# Patient Record
Sex: Male | Born: 1955 | Race: White | Hispanic: No | Smoking: Never smoker
Health system: Southern US, Community
[De-identification: ages and names within clinical notes are randomized; demographics above are authoritative.]

## PROBLEM LIST (undated history)

## (undated) DIAGNOSIS — F1020 Alcohol dependence, uncomplicated: Secondary | ICD-10-CM

## (undated) DIAGNOSIS — IMO0001 Reserved for inherently not codable concepts without codable children: Secondary | ICD-10-CM

## (undated) DIAGNOSIS — F329 Major depressive disorder, single episode, unspecified: Secondary | ICD-10-CM

## (undated) DIAGNOSIS — E785 Hyperlipidemia, unspecified: Secondary | ICD-10-CM

## (undated) DIAGNOSIS — F32A Depression, unspecified: Secondary | ICD-10-CM

## (undated) DIAGNOSIS — F419 Anxiety disorder, unspecified: Secondary | ICD-10-CM

## (undated) DIAGNOSIS — I10 Essential (primary) hypertension: Secondary | ICD-10-CM

## (undated) HISTORY — DX: Depression, unspecified: F32.A

## (undated) HISTORY — DX: Essential (primary) hypertension: I10

## (undated) HISTORY — DX: Anxiety disorder, unspecified: F41.9

## (undated) HISTORY — DX: Hyperlipidemia, unspecified: E78.5

## (undated) HISTORY — DX: Alcohol dependence, uncomplicated: F10.20

## (undated) HISTORY — DX: Reserved for inherently not codable concepts without codable children: IMO0001

## (undated) HISTORY — PX: WISDOM TOOTH EXTRACTION: SHX21

## (undated) HISTORY — DX: Major depressive disorder, single episode, unspecified: F32.9

---

## 2011-04-16 ENCOUNTER — Other Ambulatory Visit: Payer: Self-pay | Admitting: Family Medicine

## 2011-04-16 DIAGNOSIS — R748 Abnormal levels of other serum enzymes: Secondary | ICD-10-CM

## 2011-04-24 ENCOUNTER — Ambulatory Visit
Admission: RE | Admit: 2011-04-24 | Discharge: 2011-04-24 | Disposition: A | Discharge status: Home / Self Care | Payer: 59 | Source: Ambulatory Visit | Attending: Family Medicine | Admitting: Family Medicine

## 2011-04-24 DIAGNOSIS — R748 Abnormal levels of other serum enzymes: Secondary | ICD-10-CM

## 2014-01-28 DIAGNOSIS — E785 Hyperlipidemia, unspecified: Secondary | ICD-10-CM | POA: Insufficient documentation

## 2014-01-28 DIAGNOSIS — I10 Essential (primary) hypertension: Secondary | ICD-10-CM | POA: Insufficient documentation

## 2014-08-22 DIAGNOSIS — R251 Tremor, unspecified: Secondary | ICD-10-CM | POA: Insufficient documentation

## 2016-08-06 DIAGNOSIS — G20A1 Parkinson's disease without dyskinesia, without mention of fluctuations: Secondary | ICD-10-CM | POA: Insufficient documentation

## 2016-08-06 DIAGNOSIS — G20 Parkinson's disease: Secondary | ICD-10-CM | POA: Insufficient documentation

## 2017-05-01 DIAGNOSIS — N200 Calculus of kidney: Secondary | ICD-10-CM | POA: Insufficient documentation

## 2018-09-09 ENCOUNTER — Telehealth (HOSPITAL_COMMUNITY): Payer: Self-pay | Admitting: Psychology

## 2018-09-14 ENCOUNTER — Encounter (HOSPITAL_COMMUNITY): Payer: Self-pay | Admitting: Psychology

## 2018-09-14 ENCOUNTER — Telehealth (HOSPITAL_COMMUNITY): Payer: Self-pay | Admitting: Psychology

## 2018-09-14 ENCOUNTER — Ambulatory Visit (INDEPENDENT_AMBULATORY_CARE_PROVIDER_SITE_OTHER): Payer: 59 | Admitting: Psychology

## 2018-09-14 ENCOUNTER — Other Ambulatory Visit: Payer: Self-pay

## 2018-09-14 DIAGNOSIS — Z811 Family history of alcohol abuse and dependence: Secondary | ICD-10-CM | POA: Diagnosis not present

## 2018-09-14 DIAGNOSIS — G20 Parkinson's disease: Secondary | ICD-10-CM

## 2018-09-14 DIAGNOSIS — F1020 Alcohol dependence, uncomplicated: Secondary | ICD-10-CM | POA: Diagnosis not present

## 2018-09-16 ENCOUNTER — Other Ambulatory Visit: Payer: Self-pay

## 2018-09-16 ENCOUNTER — Other Ambulatory Visit (HOSPITAL_COMMUNITY): Payer: 59 | Attending: Psychiatry | Admitting: Medical

## 2018-09-16 ENCOUNTER — Encounter (HOSPITAL_COMMUNITY): Payer: Self-pay | Admitting: Medical

## 2018-09-16 VITALS — BP 129/84 | HR 61 | Ht 71.0 in | Wt 193.0 lb

## 2018-09-16 DIAGNOSIS — F411 Generalized anxiety disorder: Secondary | ICD-10-CM | POA: Insufficient documentation

## 2018-09-16 DIAGNOSIS — K7010 Alcoholic hepatitis without ascites: Secondary | ICD-10-CM

## 2018-09-16 DIAGNOSIS — I10 Essential (primary) hypertension: Secondary | ICD-10-CM | POA: Insufficient documentation

## 2018-09-16 DIAGNOSIS — Z79899 Other long term (current) drug therapy: Secondary | ICD-10-CM | POA: Insufficient documentation

## 2018-09-16 DIAGNOSIS — Z87442 Personal history of urinary calculi: Secondary | ICD-10-CM | POA: Insufficient documentation

## 2018-09-16 DIAGNOSIS — G20 Parkinson's disease: Secondary | ICD-10-CM | POA: Insufficient documentation

## 2018-09-16 DIAGNOSIS — F1311 Sedative, hypnotic or anxiolytic abuse, in remission: Secondary | ICD-10-CM | POA: Insufficient documentation

## 2018-09-16 DIAGNOSIS — F341 Dysthymic disorder: Secondary | ICD-10-CM | POA: Insufficient documentation

## 2018-09-16 DIAGNOSIS — Z8659 Personal history of other mental and behavioral disorders: Secondary | ICD-10-CM | POA: Insufficient documentation

## 2018-09-16 DIAGNOSIS — F1994 Other psychoactive substance use, unspecified with psychoactive substance-induced mood disorder: Secondary | ICD-10-CM | POA: Diagnosis not present

## 2018-09-16 DIAGNOSIS — Z6372 Alcoholism and drug addiction in family: Secondary | ICD-10-CM | POA: Diagnosis not present

## 2018-09-16 DIAGNOSIS — F1020 Alcohol dependence, uncomplicated: Secondary | ICD-10-CM | POA: Diagnosis not present

## 2018-09-16 DIAGNOSIS — E785 Hyperlipidemia, unspecified: Secondary | ICD-10-CM

## 2018-09-16 DIAGNOSIS — F19980 Other psychoactive substance use, unspecified with psychoactive substance-induced anxiety disorder: Secondary | ICD-10-CM
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
## 2018-09-17 ENCOUNTER — Other Ambulatory Visit (HOSPITAL_COMMUNITY): Payer: 59 | Admitting: Psychology

## 2018-09-17 ENCOUNTER — Other Ambulatory Visit: Payer: Self-pay

## 2018-09-17 DIAGNOSIS — F1020 Alcohol dependence, uncomplicated: Secondary | ICD-10-CM

## 2018-09-17 DIAGNOSIS — Z6372 Alcoholism and drug addiction in family: Secondary | ICD-10-CM

## 2018-09-17 DIAGNOSIS — Z8659 Personal history of other mental and behavioral disorders: Secondary | ICD-10-CM

## 2018-09-19 NOTE — Addendum Note (Signed)
Addended by: Court Joy on: 09/19/2018 03:36 PM   Modules accepted: Kipp Brood

## 2018-09-21 ENCOUNTER — Other Ambulatory Visit (HOSPITAL_COMMUNITY): Payer: 59

## 2018-09-21 ENCOUNTER — Telehealth (HOSPITAL_COMMUNITY): Payer: Self-pay | Admitting: Psychology

## 2018-09-21 ENCOUNTER — Encounter (HOSPITAL_COMMUNITY): Payer: Self-pay | Admitting: Psychology

## 2018-09-21 NOTE — Progress Notes (Signed)
Daily Group Progress Note  Program: CD-IOP   09/21/2018 Kenneth Solomon [id number]  Diagnosis: Alcohol Use Disorder, Severe, Chronic PTSD  Sobriety Date: 08/27/18  Group Time: 1-2:30pm  Participation Level: Active  Behavioral Response: Appropriate  Type of Therapy: Process Group  Interventions: Supportive  Topic: Process: The first part of group began with process. Group members shared their experiences in early recovery since we last met on Wednesday. Group members participated in a Popsicle stick check-in activity. Three drug tests were collected.   Group Time: 2:30-4pm  Participation Level: Active  Behavioral Response: Appropriate  Type of Therapy: Psycho-education Group  Interventions: CBT  Topic: Psycho-ed: Self Compassion & Graduation; The second half of group was spent in psycho-ed discussing the importance of creating a daily schedule during times of uncertainty. During the latter part of psycho-ed, a graduation ceremony was held for a group member who successfully completed CD-IOP.  Summary: The patient reported that he had attended no AA meetings since we last met on Wednesday. The patient's Popsicle stick check in word was "gratitude." The patient shared about how grateful he is to have supportive friends, family, and daughter. The patient shared about his previous times in sobriety and how his own thinking allowed him to slip back into alcoholism. The patient was active during the process portion of group and offered supportive feedback to other group members. During the psycho-ed portion of group, the patient completed his daily schedule with the help of a CD IOP counselor. He continues to engage actively in group sessions and responded well to this intervention.    UDS collected: No  AA/NA attended?: No  Sponsor?: No   Brandon Melnick, LCAS 09/21/2018 1:46 PM

## 2018-09-22 NOTE — Progress Notes (Signed)
    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 Counselor facilitated CBT-based and emotion-focused processing questions to help PTs discuss their recovery from mind-altering drugs/alcohol. Emphasis was placed on PTs disclosing their challenges and successes pertaining to their treatment plan goals. One member had relapsed and appeared upset when disclosing his new soriety date. 3 Members met w/ [name], PA-C for medication management. One member graduated successfully. One new member started group today.      Group Time: 2:30-4  Participation Level: Active  Behavioral Response: Appropriate  Type of Therapy: Psycho-education Group  Interventions: CBT  Topic: Patients were led in a 1 hour psychoeducation session on manageing anxiety by [facility] Lissa Morales. He led group in a discussion on cognitive challenging for anxiety and stress reduction. Counselor also led a 10 min stress reduction meditation which emphasized grounding and deep breathing.     Summary: PT was attentive, engaged, and quiet during his first group session. PT did not share but listened  actively to other members. PT met w/ program director to establish care. A UDS sample was collected.   UDS collected: Yes Results: negative  AA/NA attended?: No  Sponsor?: No   Kenneth Solomon, [place] 09/22/2018 12:02 PM

## 2018-09-23 ENCOUNTER — Other Ambulatory Visit (HOSPITAL_COMMUNITY): Payer: 59 | Admitting: Psychology

## 2018-09-23 ENCOUNTER — Other Ambulatory Visit: Payer: Self-pay

## 2018-09-23 DIAGNOSIS — Z8659 Personal history of other mental and behavioral disorders: Secondary | ICD-10-CM

## 2018-09-23 DIAGNOSIS — F1994 Other psychoactive substance use, unspecified with psychoactive substance-induced mood disorder: Secondary | ICD-10-CM

## 2018-09-23 DIAGNOSIS — F1020 Alcohol dependence, uncomplicated: Secondary | ICD-10-CM

## 2018-09-23 DIAGNOSIS — G20 Parkinson's disease: Secondary | ICD-10-CM

## 2018-09-23 DIAGNOSIS — K7010 Alcoholic hepatitis without ascites: Secondary | ICD-10-CM

## 2018-09-23 DIAGNOSIS — E785 Hyperlipidemia, unspecified: Secondary | ICD-10-CM

## 2018-09-23 DIAGNOSIS — I10 Essential (primary) hypertension: Secondary | ICD-10-CM

## 2018-09-23 DIAGNOSIS — F411 Generalized anxiety disorder: Secondary | ICD-10-CM

## 2018-09-23 DIAGNOSIS — F1311 Sedative, hypnotic or anxiolytic abuse, in remission: Secondary | ICD-10-CM

## 2018-09-23 DIAGNOSIS — F19980 Other psychoactive substance use, unspecified with psychoactive substance-induced anxiety disorder: Secondary | ICD-10-CM

## 2018-09-23 DIAGNOSIS — Z87442 Personal history of urinary calculi: Secondary | ICD-10-CM

## 2018-09-23 DIAGNOSIS — Z6372 Alcoholism and drug addiction in family: Secondary | ICD-10-CM

## 2018-09-23 NOTE — Progress Notes (Signed)
   [facility] Follow-up Outpatient Visit   Date: 09/23/2018  Admission Date: 09/16/2018 Sobriety date: 08/27/2018  Subjective: "I'M NOT AS ANXIOUS"  HPI ; CDIOP Provider FU This is the first FU visit since staring CD IOP for pt.He missed Group with GI illness when originally scheduled to be seen. He did not feel need for MAT for cravings.He declined Naltrexone from [facility] and [place] here. He reports that living with his daughter and family keeps him busy and thoughts/urges to drink do not occur. He has significant recovery history. He reports his anxiety is markedly improved as he gets farther away from his last drink/drunk. Parkinson's tremors are also improved with abstinence which he acknowledges.He drove himself to clinic today which he says helped his self esteem He is scheduled to transfer his Neurology care to [facility]. Other medications are reviewed and patient is not taking any Psychiatric meds  Review of Systems: Psychiatric: Agitation: Parkinson's tremors improved Hallucination: No Depressed Mood: Moderate by PHQ9 09/17/18 Insomnia: No Hypersomnia: No Altered Concentration: No Feels Worthless: Several days Grandiose Ideas: No Belief In Special Powers: No New/Increased Substance Abuse: No Compulsions: No  Neurologic: + Parkinson's Headache: No Seizure: No Paresthesias: No  Current Medications: atorvastatin 40 MG tablet  Commonly known as: LIPITOR  take 1 tablet by mouth once daily   Carbidopa-Levodopa ER 25-100 MG tablet controlled release  Commonly known as: SINEMET CR  1 po at 800, noon, 4 pm   escitalopram 10 MG tablet  Commonly known as: LEXAPRO  Take 10 mg by mouth daily.   multivitamin tablet  Take by mouth.   olmesartan 20 MG tablet  Commonly known as: BENICAR  TAKE 1 TABLET BY MOUTH ONCE DAILY   propranolol ER 80 MG 24 hr capsule  Commonly known as: INDERAL LA  take 1 capsule by mouth once daily for TREMOR      Mental Status  Examination  Appearance: Neat/Tremulous (marked decrease form initial assessment Alert: Yes Attention: good  Cooperative: Yes Eye Contact: Good Speech: Clear and coherent Psychomotor Activity: Parkinson tremor/gait Memory/Concentration: Admits when he went to counseling he would present /share/manipulate in a manner that would convince therapist he was fine.Never really dalt with true feelings about the 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 Copy is intact Oriented: person, place, time/date and situation Mood: Mild anxiety Affect: Appropriate and Congruent Thought Processes and Associations: Coherent and Intact Fund of Knowledge: Good Thought Content: WDL/subconscious trauma informed Insight: Intact Judgement: Improving  TZG:[id number]  PDMP: Confirms chronic rx for Ativan with 4 day Rx Klonopin for D/C of Ativan  Diagnosis:  The following issues were addressed:  0 Alcohol use disorder, severe, dependence (HCC)  0 History of posttraumatic stress disorder (PTSD)  0 Dysfunctional family due to alcoholism  0 Sedative, hypnotic or anxiolytic abuse, in remission (HCC)  0 Acute alcoholic hepatitis  0 Substance induced mood disorder (HCC)  0 Substance-induced anxiety disorder (HCC)  0 Parkinson disease (HCC)  0 Anxiety state  0 Essential hypertension  0 Hyperlipidemia, unspecified hyperlipidemia type  0 History of kidney stones   Assessment: Stabilizing 27 days after last drink/ 22 days after detox Need Neurology FU  Treatment Plan:Continue CDIOP per Initial Assessment.FU 10 days  Pt is given permission to be honest with Counselor here.He accepts the invitation.   Maryjean Morn, PA-CPatient ID: Kenneth Solomon, male   DOB: 02-Mar-1956, 63 y.o.   MRN: [id number]

## 2018-09-24 ENCOUNTER — Encounter (HOSPITAL_COMMUNITY): Payer: Self-pay | Admitting: Medical

## 2018-09-24 ENCOUNTER — Other Ambulatory Visit: Payer: Self-pay

## 2018-09-24 ENCOUNTER — Other Ambulatory Visit (HOSPITAL_COMMUNITY): Payer: 59 | Admitting: Psychology

## 2018-09-24 DIAGNOSIS — F1020 Alcohol dependence, uncomplicated: Secondary | ICD-10-CM | POA: Diagnosis not present

## 2018-09-24 DIAGNOSIS — Z8659 Personal history of other mental and behavioral disorders: Secondary | ICD-10-CM

## 2018-09-28 ENCOUNTER — Other Ambulatory Visit: Payer: Self-pay

## 2018-09-28 ENCOUNTER — Other Ambulatory Visit (HOSPITAL_COMMUNITY): Payer: 59 | Admitting: Psychology

## 2018-09-28 DIAGNOSIS — F1020 Alcohol dependence, uncomplicated: Secondary | ICD-10-CM

## 2018-09-28 DIAGNOSIS — F1994 Other psychoactive substance use, unspecified with psychoactive substance-induced mood disorder: Secondary | ICD-10-CM

## 2018-09-28 DIAGNOSIS — Z8659 Personal history of other mental and behavioral disorders: Secondary | ICD-10-CM

## 2018-09-29 ENCOUNTER — Encounter (HOSPITAL_COMMUNITY): Payer: Self-pay | Admitting: Psychology

## 2018-09-29 NOTE — Progress Notes (Signed)
    Daily Group Progress Note  Program: CD-IOP   09/29/2018 Kenneth Solomon [id number]  Diagnosis:  Alcohol use disorder, severe, dependence ([place])  History of posttraumatic stress disorder (PTSD)  Dysfunctional family due to alcoholism  Sedative, hypnotic or anxiolytic abuse, in remission ([place])  Acute alcoholic hepatitis  Substance induced mood disorder ([place])  Substance-induced anxiety disorder ([place])  Parkinson disease ([place])  Anxiety state  Essential hypertension  Hyperlipidemia, unspecified hyperlipidemia type  History of kidney stones  Stage of Change: Planning  Sobriety Date: 2/28  Group Time: 1-2:30  Participation Level: Active  Behavioral Response: Appropriate and Sharing  Type of Therapy: Process Group  Interventions: CBT and Motivational Interviewing  Topic: PTs were active and engaged in group processing session. Counselor facilitated CBT-based and emotion-focused processing questions to help PTs discuss their recovery from mind-altering drugs/alcohol. Emphasis was placed on PTs disclosing their challenges and successes pertaining to their treatment plan goals. One new member joined today.       Group Time: 2:30-4  Participation Level: Active  Behavioral Response: Appropriate and Sharing  Type of Therapy: Psycho-education Group  Interventions: CBT and Assertiveness Training  Topic: Patients were led in a 1 hour psychoeducation session on improving communication, assertiveness training, and refusal skills. PTs were provided a handout on styles of communication and asked to identify their methods of communicating. PTs were challenged to consider the implications for their communication style on their recovery.     Summary: PT was active, engaged, and more upbeat and hopeful in session than previous sessions. He reported his anxiety was down. He met w/ Investment banker, operational for medication f/u. He reported he drove himself to group today. He discussed  having a "drinking dream" w/ the group and asked for feedback about this. PT is working on his goals of staying active and walking daily as he is able.    UDS collected: No Results: negative  AA/NA attended?: NoThursday  Sponsor?: No   [name], [facility], [place] 09/29/2018 10:28 AM

## 2018-09-29 NOTE — Progress Notes (Signed)
    Daily Group Progress Note  Program: CD-IOP   09/29/2018 Kenneth Solomon [id number]  Diagnosis:  Alcohol use disorder, severe, dependence (HCC)  History of posttraumatic stress disorder (PTSD)  Stage of Change: Planning  Sobriety Date: 2/27  Group Time: 1-2:30  Participation Level: Active  Behavioral Response: Appropriate and Sharing  Type of Therapy: Process Group  Interventions: CBT  Topic: PTs were active and engaged in group processing session. Counselor facilitated CBT-based and emotion-focused processing questions to help PTs discuss their recovery from mind-altering drugs/alcohol. Emphasis was placed on PTs disclosing their challenges and successes pertaining to their treatment plan goals. One new member joined today.       Group Time: 2:30-4  Participation Level: Active  Behavioral Response: Appropriate and Sharing  Type of Therapy: Psycho-education Group  Interventions: CBT and Meditation: mindfulness, guided imagery, grounding  Topic: Patients were led in a 1 hour psychoeducation session on various forms of mindfulness including guided imagery and grounding techniques for mental, physical, and soothing grounding. PT participated actively in all activities.     Summary: Pt was minimally active and engaged in session. He listens attentively but does not share unless asked to. He denies any thoughts of drinking. He reports improved sleep and is "feeling more at peace". PT's goals include becoming "more useful around the house". PT stated he struggled to engaged mindfulness activities since his Parkinson sxs distracted him.    UDS collected: No Results: negative  AA/NA attended?: No  Sponsor?: No   Elwanda Brooklyn, [facility] 09/29/2018 12:02 PM

## 2018-09-30 ENCOUNTER — Other Ambulatory Visit: Payer: Self-pay

## 2018-09-30 ENCOUNTER — Other Ambulatory Visit (HOSPITAL_COMMUNITY): Payer: 59 | Attending: Psychiatry | Admitting: Psychology

## 2018-09-30 DIAGNOSIS — F4312 Post-traumatic stress disorder, chronic: Secondary | ICD-10-CM | POA: Diagnosis not present

## 2018-09-30 DIAGNOSIS — F1994 Other psychoactive substance use, unspecified with psychoactive substance-induced mood disorder: Secondary | ICD-10-CM | POA: Insufficient documentation

## 2018-09-30 DIAGNOSIS — G20 Parkinson's disease: Secondary | ICD-10-CM | POA: Insufficient documentation

## 2018-09-30 DIAGNOSIS — F1020 Alcohol dependence, uncomplicated: Secondary | ICD-10-CM | POA: Diagnosis not present

## 2018-09-30 DIAGNOSIS — Z8659 Personal history of other mental and behavioral disorders: Secondary | ICD-10-CM

## 2018-10-01 ENCOUNTER — Other Ambulatory Visit: Payer: Self-pay

## 2018-10-01 ENCOUNTER — Encounter (HOSPITAL_COMMUNITY): Payer: Self-pay | Admitting: Psychology

## 2018-10-01 ENCOUNTER — Other Ambulatory Visit (HOSPITAL_COMMUNITY): Payer: 59 | Admitting: Licensed Clinical Social Worker

## 2018-10-01 DIAGNOSIS — F1020 Alcohol dependence, uncomplicated: Secondary | ICD-10-CM

## 2018-10-01 DIAGNOSIS — Z8659 Personal history of other mental and behavioral disorders: Secondary | ICD-10-CM

## 2018-10-01 NOTE — Progress Notes (Addendum)
  Virtual Visit via Video Note  I connected with Kenneth Solomon on 10/01/18 at  1:00 PM EDT by a video enabled telemedicine application and verified that I am speaking with the correct person using two identifiers.   I discussed the limitations of evaluation and management by telemedicine and the availability of in person appointments. The patient expressed understanding and agreed to proceed.      I discussed the assessment and treatment plan with the patient. The patient was provided an opportunity to ask questions and all were answered. The patient agreed with the plan and demonstrated an understanding of the instructions.   The patient was advised to call back or seek an in-person evaluation if the symptoms worsen or if the condition fails to improve as anticipated.  I provided 180 minutes of non-face-to-face time during this encounter.   [name], LCAS-A    Daily Group Progress Note  Program: CD-IOP   10/01/2018 Kenneth Solomon [id number]  Diagnosis:  Alcohol use disorder, severe, dependence (HCC)  History of posttraumatic stress disorder (PTSD)  Substance induced mood disorder (HCC)  Stage of Change: Planning  Sobriety Date: 2/27  Group Time: 1-2:30  Participation Level: Active  Behavioral Response: Appropriate and Sharing  Type of Therapy: Process Group  Interventions: CBT and Motivational Interviewing  Topic: PTs were active and engaged in group processing session. Counselor facilitated CBT-based and emotion-focused processing questions to help PTs discuss their recovery from mind-altering drugs/alcohol. Emphasis was placed on PTs disclosing their challenges and successes pertaining to their treatment plan goals.   Some PTs joined via [employer] while 2 members joined in-person. One member came but had to leave early due to an "emergency" w/ his mother at home. One member shared about her relapse on ETOH over the weekend.      Group Time:  2:30-4  Participation Level: Active  Behavioral Response: Appropriate and Sharing  Type of Therapy: Psycho-education Group  Interventions: CBT  Topic: Patients were led in a 1 hour psychoeducation session on core beliefs. A virtual handout was utilized through screen sharing and emailed to PTs to be able to print off at their homes. PTs discussed negative core beliefs and ways to dispute these, as well as evidence that is rejected.    Summary: PT was moderately active and engaged in virtual group session. He continues to not have gone to any AA meetings since starting group. PT had positive feedback for another group member about her relapse over the weekend. PT states his most recent relapse began when it "snuck up on him". PT identified his core belief as "I am unlovable" due to having 3 failed marriages. Counselor and PT identified ways to challenge this negative core belief and identified information/evidence he was discounting including his family's love for him.    UDS collected: No Results:   AA/NA attended?: No  Sponsor?: No   [name], LCMHCA, LCASA 10/01/2018 8:20 AM

## 2018-10-02 NOTE — Progress Notes (Signed)
Virtual Visit via Video Note  I connected with Kenneth Solomon on 10/02/18 at  1:00 PM EDT by a video enabled telemedicine application and verified that I am speaking with the correct person using two identifiers.   I discussed the limitations of evaluation and management by telemedicine and the availability of in person appointments. The patient expressed understanding and agreed to proceed.    I discussed the assessment and treatment plan with the patient. The patient was provided an opportunity to ask questions and all were answered. The patient agreed with the plan and demonstrated an understanding of the instructions.   The patient was advised to call back or seek an in-person evaluation if the symptoms worsen or if the condition fails to improve as anticipated.  I provided 50 minutes of non-face-to-face time during this encounter.   Archie Balboa, LCAS-A    PT and Counselor met for CD-IOP Treatment Planning Session via Webex. PT decided on his goals for tx being:  1. Maintain and learn about sobriety from all mind-altering substances. 2. PT will build social support for recovery lifestyle. 3. PT will gain insight into his anxiety triggers and learn to manage the distress better. 4. PT will put his house on the market before leaving tx.

## 2018-10-05 ENCOUNTER — Other Ambulatory Visit (HOSPITAL_COMMUNITY): Payer: 59 | Admitting: Psychology

## 2018-10-05 ENCOUNTER — Other Ambulatory Visit (HOSPITAL_COMMUNITY): Payer: Self-pay | Admitting: Medical

## 2018-10-05 ENCOUNTER — Encounter (HOSPITAL_COMMUNITY): Payer: Self-pay | Admitting: Psychology

## 2018-10-05 ENCOUNTER — Telehealth (HOSPITAL_COMMUNITY): Payer: Self-pay | Admitting: Psychology

## 2018-10-05 DIAGNOSIS — G20 Parkinson's disease: Secondary | ICD-10-CM

## 2018-10-05 DIAGNOSIS — F1994 Other psychoactive substance use, unspecified with psychoactive substance-induced mood disorder: Secondary | ICD-10-CM

## 2018-10-05 DIAGNOSIS — F1020 Alcohol dependence, uncomplicated: Secondary | ICD-10-CM

## 2018-10-05 DIAGNOSIS — Z8659 Personal history of other mental and behavioral disorders: Secondary | ICD-10-CM

## 2018-10-05 MED ORDER — ESCITALOPRAM OXALATE 10 MG PO TABS: 10.0000 mg | ORAL_TABLET | Freq: Every day | ORAL | 0 refills | Status: DC

## 2018-10-05 MED ORDER — NALTREXONE HCL 50 MG PO TABS: 50.0000 mg | ORAL_TABLET | Freq: Every day | ORAL | 0 refills | Status: DC

## 2018-10-05 MED ORDER — PROPRANOLOL HCL 40 MG PO TABS: ORAL_TABLET | ORAL | 0 refills | Status: DC

## 2018-10-05 NOTE — Progress Notes (Signed)
    Daily Group Progress Note  Program: CD-IOP   10/05/2018 [name] [id number]  Diagnosis: alcohol use disorder, Severe, chronic PTSD  Sobriety Date: 08/27/18  Group Time: 1-2:30pm  Participation Level: Active  Behavioral Response: Appropriate and Sharing  Type of Therapy: Process Group  Interventions: Supportive  Topic: Process: The first part of group began with process. Group members shared their experiences in early recovery since we last met on Monday. Four group members were physically present in the group room while five group members joined the group virtually. All group members were present. One drug test was collected.   Group Time: 2:30-4pm  Participation Level: Active  Behavioral Response: Sharing  Type of Therapy: Psycho-education Group  Interventions: CBT  Topic: Psycho-ed: Relapse Prevention; The second half of group was spent in psycho-ed discussing relapse prevention. A handout was displayed in the group conference explaining how a trigger can lead to use and ways to stop thought from becoming a craving. Group members shared their own examples and strategies for challenging thoughts of using. During the last 20 minutes of group, a graduation ceremony was held.  Kind words were shared by all towards the graduating member. She had changed so much since first entering the program and she expressed her appreciation and gratitude through tears.    Summary: The patient attended the group in-person. Patient reported no attendance at in-person or online [place] meetings since we last met. A drug test was collected from the patient. The patient shared about a trigger he experienced while watching a TV show. The patient processed feelings around having thoughts about drinking when he does not want to drink. The patient shared with the group about his life experiences that led him to decide to start CD IOP and his diagnosis with Parkinson's. The patient was engaged during  the process portion of group and offered feedback to his fellow group members. During the psycho-ed portion of group, the patient was alert and engaged with the group discussion.  UDS collected: Yes Results: Pending  AA/NA attended?: No  Sponsor?: No   Brandon Melnick, LCAS 10/05/2018 9:27 AM

## 2018-10-07 ENCOUNTER — Other Ambulatory Visit (HOSPITAL_COMMUNITY): Payer: 59 | Admitting: Psychology

## 2018-10-07 DIAGNOSIS — F1020 Alcohol dependence, uncomplicated: Secondary | ICD-10-CM

## 2018-10-07 DIAGNOSIS — G20 Parkinson's disease: Secondary | ICD-10-CM

## 2018-10-07 DIAGNOSIS — Z8659 Personal history of other mental and behavioral disorders: Secondary | ICD-10-CM

## 2018-10-08 ENCOUNTER — Other Ambulatory Visit (HOSPITAL_COMMUNITY): Payer: 59 | Admitting: Psychology

## 2018-10-08 ENCOUNTER — Encounter (HOSPITAL_COMMUNITY): Payer: Self-pay | Admitting: Psychology

## 2018-10-08 ENCOUNTER — Other Ambulatory Visit: Payer: Self-pay

## 2018-10-08 DIAGNOSIS — F1020 Alcohol dependence, uncomplicated: Secondary | ICD-10-CM

## 2018-10-08 DIAGNOSIS — Z8659 Personal history of other mental and behavioral disorders: Secondary | ICD-10-CM

## 2018-10-08 NOTE — Progress Notes (Signed)
Virtual Visit via Video Note  I connected with Kenneth Solomon on 10/08/18 at  1:00 PM EDT by a video enabled telemedicine application and verified that I am speaking with the correct person using two identifiers.   I discussed the limitations of evaluation and management by telemedicine and the availability of in person appointments. The patient expressed understanding and agreed to proceed.     I discussed the assessment and treatment plan with the patient. The patient was provided an opportunity to ask questions and all were answered. The patient agreed with the plan and demonstrated an understanding of the instructions.   The patient was advised to call back or seek an in-person evaluation if the symptoms worsen or if the condition fails to improve as anticipated.  I provided 180 minutes of non-face-to-face time during this encounter.   [name], LCAS-A     Daily Group Progress Note  Program: CD-IOP   10/08/2018 Kenneth Solomon [id number]  Diagnosis:  Alcohol use disorder, severe, dependence (HCC)  History of posttraumatic stress disorder (PTSD)  Stage of Change: Planning, Action  Sobriety Date: 2/28  Group Time: 1-2:30  Participation Level: Active  Behavioral Response: Appropriate and Sharing  Type of Therapy: Process Group  Interventions: CBT  Topic: PTs were active and engaged in group processing session. Counselor facilitated CBT-based and emotion-focused processing questions to help PTs discuss their recovery from mind-altering drugs/alcohol. Emphasis was placed on PTs disclosing their challenges and successes pertaining to their treatment plan goals.   All PTs joined via webex.      Group Time: 2:30-4  Participation Level: Active  Behavioral Response: Appropriate and Sharing  Type of Therapy: Psycho-education Group  Interventions: CBT  Topic: PTs were led in a psychoeducation session which included a 15 min body scan meditation and continuation  of a talk on self care, wellness, and rating their wellness wheel. PTs were asked to rate their level of wellness in various areas of their life and asked to create meaningful, measurable goals regarding these challenges.    Summary: Pt was active and engaged in session. He reported he is grateful for his family. He is feeling physically better which he attributes, in part to this group. He reports his son is struggling w/ alcohol addiction and PT is concerned for his son's health. PT admits he has to "let his son figure out what he needs for himself". PT reported the body scan was more relaxing than previous meditations he has tried. PT identified his goals for his wellness wheel are increasing "social" and "physical". He hopes to walk more w/ his neighbors on a weekly basis.   UDS collected: Yes Results: Pending  AA/NA attended?: Yes Tuesday  Sponsor?: No   [name], [facility], LCASA 10/08/2018 2:57 PM

## 2018-10-09 ENCOUNTER — Encounter (HOSPITAL_COMMUNITY): Payer: Self-pay | Admitting: Licensed Clinical Social Worker

## 2018-10-09 ENCOUNTER — Other Ambulatory Visit: Payer: Self-pay

## 2018-10-09 ENCOUNTER — Ambulatory Visit (HOSPITAL_COMMUNITY): Payer: 59 | Admitting: Licensed Clinical Social Worker

## 2018-10-09 DIAGNOSIS — Z8659 Personal history of other mental and behavioral disorders: Secondary | ICD-10-CM

## 2018-10-09 DIAGNOSIS — F1020 Alcohol dependence, uncomplicated: Secondary | ICD-10-CM

## 2018-10-09 NOTE — Progress Notes (Signed)
Virtual Visit via Video Note  I connected with Kenneth Solomon on 10/09/18 at  9:00 AM EDT by a video enabled telemedicine application and verified that I am speaking with the correct person using two identifiers.   I discussed the limitations of evaluation and management by telemedicine and the availability of in person appointments. The patient expressed understanding and agreed to proceed.     I discussed the assessment and treatment plan with the patient. The patient was provided an opportunity to ask questions and all were answered. The patient agreed with the plan and demonstrated an understanding of the instructions.   The patient was advised to call back or seek an in-person evaluation if the symptoms worsen or if the condition fails to improve as anticipated.  I provided 50 minutes of non-face-to-face time during this encounter.   [name], LCAS-A    CD-IOP INDIVIDUAL SESSION  THERAPIST PROGRESS NOTE  Session Time: 9-10  Participation Level: Active  Behavioral Response: Neat and Well GroomedAlertAnxious and tearful  Type of Therapy: Individual Therapy  Treatment Goals addressed: Anxiety  Interventions: CBT and Supportive  Summary: Kenneth Solomon is a 63 y.o. male who presents with Alcohol Use Disorder, Severe. He is active and engaged for his virtual weekly individual session as part of CD-IOP. He states he noticed he was "a bit nervous" before starting the meeting because he sensed he wanted to talk about his Parkinson's and impact it has on his quality of life. PT does not feel he has trouble "thinking about death or giving up control" but he does not like not being able to be as useful as he once was. PT struggles to write/type and counselor offered support on utilizing speech to type software through his smartphone. PT is recommended a book on finding meaning in life by Janus Molder. PT discussed his plans to go to [place], [place] w/ his daughter next weekend to  begin clearinghis house out to sell.  Suicidal/Homicidal: Nowithout intent/plan  Therapist Response: Counselor used open questions, active listening and therapeutic reflection. Counselor helped PT to disclose his feelings and check in w/ biomarkers of his tearfulness and emotions attached to his memories. Counselor offered psychoeducation on using "creative solutions" for common problems such as not being able to write.   Plan: Continue CD-IOP for remaining 5 weeks.  Diagnosis:    ICD-10-CM   1. Alcohol use disorder, severe, dependence (HCC) F10.20   2. History of posttraumatic stress disorder (PTSD) Z86.59        [name], LCAS-A 10/09/2018

## 2018-10-12 ENCOUNTER — Encounter (HOSPITAL_COMMUNITY): Payer: Self-pay | Admitting: Psychology

## 2018-10-12 ENCOUNTER — Encounter (HOSPITAL_COMMUNITY): Payer: Self-pay

## 2018-10-12 NOTE — Progress Notes (Signed)
    Daily Group Progress Note  Program: CD-IOP   10/07/2018 Kenneth Solomon [id number]  Diagnosis: Alcohol Use Disorder, Severe, Parkinson's   Sobriety Date: 08/27/18  Group Time: 1-2:30pm  Participation Level: Active  Behavioral Response: Appropriate  Type of Therapy: Process Group  Interventions: Supportive  Topic: Process: The first part of group began with process. Group members shared their experiences in early recovery since we last met on Monday. All group members were present. Seven group members attended the group online and one group member attended online via [place]. The Medical director met with two group members.   Group Time: 2:30-4pm  Participation Level: Active  Behavioral Response: Sharing  Type of Therapy: Psycho-education Group  Interventions: Psychosocial Skills: Self Care  Topic:Psycho-ed: Self-Care Wheel; The second half of group was spent in psycho-ed discussing self-care in eight different domains of wellness. A handout of the Wellness Wheel was shared through [place] and group members took turns reading and discussing how they incorporate self-care in each of these areas into their daily lives. During the last 20 minutes of group, a graduation ceremony was held.     Summary: The patient attended the group via [place]. Patient reported attendance at one in-person AA meeting since we last met. The patient shared with the group about his oldest son's alcoholism and the family coming to terms with his son's drinking. The patient received supportive feedback on how to support his son without enabling him. The patient also shared with the group about his experience at in-person AA meeting of 12 people. The patient reported that he enjoys going to [employer] and found it to be "encouraging." The patient was active in the process portion of group and offered supportive feedback to fellow group members. During the psycho-ed portion of group, the patient was engaged and  offered personal examples of how he uses self-care to benefit the group discussion.   UDS collected: No   AA/NA attended?: YesTuesday  Sponsor?: No   Brandon Melnick, LCAS 10/12/2018 2:11 PM

## 2018-10-13 ENCOUNTER — Encounter (HOSPITAL_COMMUNITY): Payer: Self-pay | Admitting: Psychology

## 2018-10-13 NOTE — Progress Notes (Signed)
    Daily Group Progress Note  Program: CD-IOP   10/13/2018 Kenneth Solomon [id number]  Diagnosis:  Alcohol use disorder, severe, dependence (HCC)  History of posttraumatic stress disorder (PTSD)  Parkinson disease (HCC)  Substance induced mood disorder (HCC)  Stage of Change: Planning  Sobriety Date: 2/28  Group Time: 1-2:30  Participation Level: Active  Behavioral Response: Appropriate and Sharing  Type of Therapy: Process Group  Interventions: CBT  Topic: PTs were active and engaged in group processing session. Counselor facilitated CBT-based and emotion-focused processing questions to help PTs discuss their recovery from mind-altering drugs/alcohol. Emphasis was placed on PTs disclosing their challenges and successes pertaining to their treatment plan goals.   All PTs joined via webex.     Group Time: 2:30-4  Participation Level: Active  Behavioral Response: Appropriate and Sharing  Type of Therapy: Psycho-education Group  Interventions: Meditation: Chair Yoga  Topic: PTs were led in a psychoeducation session which included a 1 hour chair yoga session led by Forde Radon, LPC and therapeutic yoga specialist. PTs were guided through various sitting and standing poses via WebEx video session. All PTs were active and engaged in session and stated the exercise was helpful for learning relaxation techniques to do at home.   Summary: PT was active and engaged in session. He was alert, and lucid while sharing. He reported he did not attend any AA meetings but was planning to attend an in-person meeting tomorrow morning at [facility]. PT had a good "low key" weekend. He denied triggers or thoughts of using. PT reported he continues to feel fear when he thinks about his future and was directed to bring himself back to the "here-and-now" to avoid anxiety. PT participated in yoga actively and was able to modify poses as needed to account for his Parkinsons Disease.     UDS collected: Yes Results: negative  AA/NA attended?: No  Sponsor?: No   [name], [facility], LCASA 10/13/2018 3:57 PM

## 2018-10-14 ENCOUNTER — Other Ambulatory Visit (HOSPITAL_COMMUNITY): Payer: 59 | Admitting: Psychology

## 2018-10-14 ENCOUNTER — Other Ambulatory Visit: Payer: Self-pay

## 2018-10-14 DIAGNOSIS — G20 Parkinson's disease: Secondary | ICD-10-CM

## 2018-10-14 DIAGNOSIS — Z8659 Personal history of other mental and behavioral disorders: Secondary | ICD-10-CM

## 2018-10-14 DIAGNOSIS — F1020 Alcohol dependence, uncomplicated: Secondary | ICD-10-CM | POA: Diagnosis not present

## 2018-10-15 ENCOUNTER — Other Ambulatory Visit (HOSPITAL_COMMUNITY): Payer: 59 | Admitting: Psychology

## 2018-10-15 DIAGNOSIS — F1020 Alcohol dependence, uncomplicated: Secondary | ICD-10-CM | POA: Diagnosis not present

## 2018-10-15 DIAGNOSIS — G20 Parkinson's disease: Secondary | ICD-10-CM

## 2018-10-15 DIAGNOSIS — Z8659 Personal history of other mental and behavioral disorders: Secondary | ICD-10-CM

## 2018-10-16 ENCOUNTER — Other Ambulatory Visit: Payer: Self-pay

## 2018-10-16 ENCOUNTER — Encounter (HOSPITAL_COMMUNITY): Payer: Self-pay

## 2018-10-16 ENCOUNTER — Ambulatory Visit: Payer: Self-pay | Admitting: Family Medicine

## 2018-10-16 NOTE — Progress Notes (Signed)
  Virtual Visit via Video Note  I connected with Kenneth Solomon on 10/16/18 at  1:00 PM EDT by a video enabled telemedicine application and verified that I am speaking with the correct person using two identifiers.   I discussed the limitations of evaluation and management by telemedicine and the availability of in person appointments. The patient expressed understanding and agreed to proceed.     I discussed the assessment and treatment plan with the patient. The patient was provided an opportunity to ask questions and all were answered. The patient agreed with the plan and demonstrated an understanding of the instructions.   The patient was advised to call back or seek an in-person evaluation if the symptoms worsen or if the condition fails to improve as anticipated.  I provided 180 minutes of non-face-to-face time during this encounter.   [name], LCAS-A    Daily Group Progress Note  Program: CD-IOP   10/16/2018 Kenneth Solomon [id number]  Diagnosis:  Alcohol use disorder, severe, dependence (HCC)  History of posttraumatic stress disorder (PTSD)  Parkinson disease (HCC)  Stage of Change: Planning  Sobriety Date: 3/28  Group Time: 1-2:30  Participation Level: Active  Behavioral Response: Appropriate and Sharing  Type of Therapy: Process Group  Interventions: CBT and Motivational Interviewing  Topic: PTs were active and engaged in group processing session. Counselor facilitated CBT-based and emotion-focused processing questions to help PTs discuss their recovery from mind-altering drugs/alcohol. Emphasis was placed on PTs disclosing their challenges and successes pertaining to their treatment plan goals.   One new patient joined group and discussed his long hx of attempting to get sober. PTs were led in a 15 min "loving-kindness" meditation for resolution of anger and resentment.      Group Time: 2:30-4  Participation Level: Active  Behavioral Response:  Appropriate and Sharing  Type of Therapy: Psycho-education Group  Interventions: CBT, Meditation: "Loving-Kindness Meditation" and Family Systems  Topic: PTs were led in a 1 hour psychoeducation session on Family Roles in Dysfunctional families. PTs were taught about various roles including "Enabler", "Mascot", "Hero", "Scapegoat", and "Lost Child". PTs were asked how these roles played out in their current lives at work/school. PTs were asked to provide any insights gleaned from studying these roles and provide feedback to each other regarding how the roles could be modified for improved social functioning.     Summary: PT was active and engaged in virtual group session today. He reported he attended 1 AA meeting in-person. He was welcoming and warm to the new group member. He asked group for feedback on "waking up w/ a brief sense of dread" that goes away after getting out of bed. PT was confronted about ways he needs to increase his intentionality in this tx. PT admitted he wants to socialize more. PT was provided w/ email link for [facility] and a contact information for [facility]. PT states he is enjoying his own meditation regimen which is helping his anxiety. PT identified himself as a mixture of "mascot and hero" in his family.    UDS collected: Yes Results: Pending  AA/NA attended?: YesThursday  Sponsor?: No   [name], [facility], LCASA 10/16/2018 2:46 PM

## 2018-10-19 ENCOUNTER — Other Ambulatory Visit (HOSPITAL_COMMUNITY): Payer: 59 | Admitting: Psychology

## 2018-10-19 ENCOUNTER — Encounter (HOSPITAL_COMMUNITY): Payer: Self-pay | Admitting: Psychology

## 2018-10-19 ENCOUNTER — Other Ambulatory Visit: Payer: Self-pay

## 2018-10-19 ENCOUNTER — Ambulatory Visit (HOSPITAL_COMMUNITY): Payer: 59 | Admitting: Medical

## 2018-10-19 DIAGNOSIS — Z6372 Alcoholism and drug addiction in family: Secondary | ICD-10-CM

## 2018-10-19 DIAGNOSIS — G20 Parkinson's disease: Secondary | ICD-10-CM

## 2018-10-19 DIAGNOSIS — F341 Dysthymic disorder: Secondary | ICD-10-CM

## 2018-10-19 DIAGNOSIS — Z87442 Personal history of urinary calculi: Secondary | ICD-10-CM

## 2018-10-19 DIAGNOSIS — Z8659 Personal history of other mental and behavioral disorders: Secondary | ICD-10-CM

## 2018-10-19 DIAGNOSIS — F1311 Sedative, hypnotic or anxiolytic abuse, in remission: Secondary | ICD-10-CM

## 2018-10-19 DIAGNOSIS — F1020 Alcohol dependence, uncomplicated: Secondary | ICD-10-CM

## 2018-10-19 DIAGNOSIS — E785 Hyperlipidemia, unspecified: Secondary | ICD-10-CM

## 2018-10-19 DIAGNOSIS — F1994 Other psychoactive substance use, unspecified with psychoactive substance-induced mood disorder: Secondary | ICD-10-CM

## 2018-10-19 DIAGNOSIS — K7010 Alcoholic hepatitis without ascites: Secondary | ICD-10-CM

## 2018-10-19 DIAGNOSIS — F19980 Other psychoactive substance use, unspecified with psychoactive substance-induced anxiety disorder: Secondary | ICD-10-CM

## 2018-10-19 DIAGNOSIS — I10 Essential (primary) hypertension: Secondary | ICD-10-CM

## 2018-10-19 NOTE — Progress Notes (Signed)
Patient ID: Kenneth Solomon, male   DOB: October 26, 1955, 63 y.o.   MRN: [id number]   [facility] Follow-up Outpatient CDIOP Virtual Visit via Video Note  I connected with Kenneth Solomon on 10/19/18 at  4:15 PM EDT by a video enabled telemedicine application and verified that I am speaking with the correct person using two identifiers.   I discussed the limitations of evaluation and management by telemedicine and the availability of in person appointments. The patient expressed understanding and agreed to proceed.   History of Present Illness:See EPIC note    Observations/Objective:See EPIC note   Assessment and Plan:See EPIC note   Follow Up Instructions:See EPIC note   I discussed the assessment and treatment plan with the patient. The patient was provided an opportunity to ask questions and all were answered. The patient agreed with the plan and demonstrated an understanding of the instructions.   The patient was advised to call back or seek an in-person evaluation if the symptoms worsen or if the condition fails to improve as anticipated.  I provided 15 minutes of non-face-to-face time during this encounter.   Maryjean Morn, PA-C  Date: 10/19/2018  Admission Date: 09/16/2018  Sobriety date:2/27/ 2020  Subjective: "I'm doing well.How are you?"  HPI : CD IOP PROVIDER FU Telepsych FU for pt in CD IOP nearly 60 days after last drink/intoxication requiring medical detoxification. Pt has noticed appetite loss.? If medications prescribed at D/C could be causing. He has enough of all his medications at present. Complication of Parkinson's remains managable as he seeks to relocate to [place].Has contacted [facility] and they are currently not taking on new patients at least until 4/29. He has started to attend outdoor 10:30 am AA meeting . He says he is going to start trying ZOOM meetings.  Review of Systems: Psychiatric: Agitation: Decreasing anxiety from 14 to 11 by  GAD-still significant Hallucination: No Depressed Mood: PHQ 9 score 11 4/16-baseline on 10 mg Lexapro Insomnia: No report Hypersomnia: No Altered Concentration: No Feels Worthless: Several days-unchanged Grandiose Ideas: No Belief In Special Powers: No New/Increased Substance Abuse: No Compulsions: No  Neurologic: Headache: No Seizure: No Paresthesias: No  Current Medications:  Mental Status Examination  Appearance:WEBEX-pt did not turn on video Alert: Yes Attention: good  Cooperative: Yes Eye Contact:NA Speech: Clear and coherent Psychomotor Activity: NA WEBEX Psych exam Pt has Parkinson's he reports a stable. Memory/Concentration: Normal/intact-admits he never dealt with trauma counselor's at any depth while in [place] Oriented: person, place, time/date and situation Mood: Euthymic Affect: NA Thought Processes and Associations: Coherent and Intact Fund of Knowledge:WDL Thought Content: WDL Insight: Limited Judgement: Intact  UMP:[id number]  PDMP: Clear  Diagnosis:  0 Alcohol use disorder, severe, dependence (HCC)  0 History of posttraumatic stress disorder (PTSD)  0 Parkinson disease (HCC)  0 Sedative, hypnotic or anxiolytic abuse, in remission (HCC)  0 Dysfunctional family due to alcoholism  0 Dysthymia  0 Substance induced mood disorder (HCC)  0 Substance-induced anxiety disorder (HCC)  0 Acute alcoholic hepatitis  0 Essential hypertension  0 Hyperlipidemia, unspecified hyperlipidemia type  0 History of kidney stones  Assessment:  Treatment Plan: Maryjean Morn, PA-C

## 2018-10-19 NOTE — Progress Notes (Addendum)
    Daily Group Progress Note  Program: CD-IOP   10/14/2018 [name] [id number]  Diagnosis: Alcohol Use Disorder, Severe  Sobriety Date: 08/28/18  Group Time: 1-2:30pm  Participation Level: Active  Behavioral Response: Sharing  Type of Therapy: Process Group  Interventions: Supportive  Topic: Process: The first part of group began with process. Group members shared their experiences in early recovery since we last met on Thursday. All group members were present and in attendance via [place]. No drug tests were collected.   Group Time: 2:30-4pm  Participation Level: Active  Behavioral Response: Appropriate  Type of Therapy: Psycho-education Group  Interventions: Family Systems  Topic: Psycho-ed: The second half of group was spent in psycho-ed discussing family roles. The "I Am From" fill in the blanks poem was sent to group members to complete. Group members took turns sharing their poems. A handout was presented to the group about different family roles and group members were encouraged to identify how these roles played out in their own families of origin.    Summary: The patient attended group via WebEx. Patient reported attending one [place] meeting since we last met for group on Thursday. The patient reported feeling "steady," and "optimistic." Patient reported he is using his family members to provide a healthy distraction from urges to drink. The patient reported no cravings since we last met. The patient processed [id number] fears and how these fears may be manifesting in frequent nightmares. The patient remained active and engaged during process. During the psycho-ed portion group, the patient was engaged in group discussion, participated in the activity, and offered feedback to fellow group members.  UDS collected: No   AA/NA attended?: YesSaturday  Sponsor?: No   Brandon Melnick, LCAS 10/19/2018 9:16 AM

## 2018-10-20 ENCOUNTER — Other Ambulatory Visit: Payer: Self-pay

## 2018-10-20 ENCOUNTER — Encounter (HOSPITAL_COMMUNITY): Payer: Self-pay | Admitting: Licensed Clinical Social Worker

## 2018-10-20 ENCOUNTER — Ambulatory Visit (HOSPITAL_COMMUNITY): Payer: 59 | Admitting: Licensed Clinical Social Worker

## 2018-10-20 DIAGNOSIS — G20 Parkinson's disease: Secondary | ICD-10-CM

## 2018-10-20 DIAGNOSIS — Z8659 Personal history of other mental and behavioral disorders: Secondary | ICD-10-CM

## 2018-10-20 DIAGNOSIS — F1020 Alcohol dependence, uncomplicated: Secondary | ICD-10-CM

## 2018-10-20 NOTE — Progress Notes (Signed)
Virtual Visit via Video Note  I connected with Kenneth Solomon on 10/20/18 at  2:30 PM EDT by a video enabled telemedicine application and verified that I am speaking with the correct person using two identifiers.   I discussed the limitations of evaluation and management by telemedicine and the availability of in person appointments. The patient expressed understanding and agreed to proceed.     I discussed the assessment and treatment plan with the patient. The patient was provided an opportunity to ask questions and all were answered. The patient agreed with the plan and demonstrated an understanding of the instructions.   The patient was advised to call back or seek an in-person evaluation if the symptoms worsen or if the condition fails to improve as anticipated.  I provided 50 minutes of non-face-to-face time during this encounter.   Archie Balboa, LCAS-A    THERAPIST PROGRESS NOTE  Session Time: 2:30-3:30  Participation Level: Active  Behavioral Response: Well GroomedAlertEuthymic  Type of Therapy: Individual Therapy  Treatment Goals addressed: Coping  Interventions: CBT and Strength-based  Summary: Kenneth Solomon is a 63 y.o. male who presents with Alcohol Use Disorder, Severe and hx of PTSD. He is reflective, quiet, and peaceful in session. HE reports he attended AA this morning and came by office to submit his UDS sample. PT met w/ [name] yesterday and has gone off Naltrexone due to loss of appetite. PT denies having any cravings. PT reports he recently drove himself to [place], [place] and "did fine". PT spent majority of session reflecting on his past marriages, relationship to his children, and meaning-making.   Suicidal/Homicidal: Nowithout intent/plan  Therapist Response: Counselor used closed ended questions to check in about PT's current medical status and psychological needs. Counselor then used open questions, active listening and reflection to help PT deepen  his understanding of his past and how he can continue to live a meaningful, fulfilling life in retirement.   Plan: Continue CD-IOP for remaining 4 weeks.  Diagnosis:    ICD-10-CM   1. Alcohol use disorder, severe, dependence ([place]) F10.20   2. History of posttraumatic stress disorder (PTSD) Z86.59   3. Parkinson disease [facility]) [place], LCAS-A 10/20/2018

## 2018-10-21 ENCOUNTER — Encounter (HOSPITAL_COMMUNITY): Payer: Self-pay | Admitting: Psychology

## 2018-10-21 ENCOUNTER — Encounter (HOSPITAL_COMMUNITY): Payer: Self-pay | Admitting: Medical

## 2018-10-21 ENCOUNTER — Other Ambulatory Visit (HOSPITAL_COMMUNITY): Payer: 59 | Admitting: Psychology

## 2018-10-21 ENCOUNTER — Other Ambulatory Visit: Payer: Self-pay

## 2018-10-21 DIAGNOSIS — Z8659 Personal history of other mental and behavioral disorders: Secondary | ICD-10-CM

## 2018-10-21 DIAGNOSIS — G20 Parkinson's disease: Secondary | ICD-10-CM

## 2018-10-21 DIAGNOSIS — F1020 Alcohol dependence, uncomplicated: Secondary | ICD-10-CM

## 2018-10-21 NOTE — Progress Notes (Signed)
  Virtual Visit via Video Note  I connected with Kenneth Solomon on 10/21/18 at  1:00 PM EDT by a video enabled telemedicine application and verified that I am speaking with the correct person using two identifiers.   I discussed the limitations of evaluation and management by telemedicine and the availability of in person appointments. The patient expressed understanding and agreed to proceed.     I discussed the assessment and treatment plan with the patient. The patient was provided an opportunity to ask questions and all were answered. The patient agreed with the plan and demonstrated an understanding of the instructions.   The patient was advised to call back or seek an in-person evaluation if the symptoms worsen or if the condition fails to improve as anticipated.  I provided 180 minutes of non-face-to-face time during this encounter.   Kenneth Solomon, LCAS-A    Daily Group Progress Note  Program: CD-IOP   10/21/2018 Phelan Schadt [id number]  Diagnosis:  Alcohol use disorder, severe, dependence ([place])  History of posttraumatic stress disorder (PTSD)  Parkinson disease ([place])  Stage of Change: Action  Sobriety Date: 2/28  Group Time: 1-2:30  Participation Level: Active  Behavioral Response: Appropriate and Sharing  Type of Therapy: Process Group  Interventions: CBT  Topic: PTs were active and engaged in virtual group processing session. Counselor facilitated CBT-based and emotion-focused processing questions to help PTs discuss their recovery from mind-altering drugs/alcohol. Emphasis was placed on PTs disclosing their challenges and successes pertaining to their treatment plan goals. UDS results were discussed, as all members were most recently found negative. Some PTs met w/ Kenneth Solomon virtually for d/c and medication management.      Group Time: 2:30-4  Participation Level: Active  Behavioral Response: Appropriate and Sharing  Type of Therapy:  Psycho-education Group  Interventions: CBT and Psychosocial Skills: Codependency  Topic: PTs were led in a 1 hour virtual psychoeducation session on Codependency and Addiction. PTs were taught the basics of what Codependency is and how it plays into addictive cylces. PTs were taughts coping skills for recognizing and changing their codependent traits. Handouts were provided via email for PTs to review.     Summary: PT was active and engaged in virtual session. He reported he attended 1 AA in-person meeting. He followed up on multiple recommendations for services for PCP and Parkinson's Support Group. He states that these services were currently suspended due to COVID-19. He states he is frustrated by this and feels he has been getting "the run-around" from places. PT reported on his recent trip to his house in [place], [place]. He was happy that he was able to drive himself.   UDS collected: No Results: Most recent 4/15, negative  AA/NA attended?: YesMonday  Sponsor?: No   Kenneth Solomon, [facility], [place] 10/21/2018 11:34 AM

## 2018-10-22 ENCOUNTER — Encounter (HOSPITAL_COMMUNITY): Payer: Self-pay | Admitting: Psychology

## 2018-10-22 ENCOUNTER — Other Ambulatory Visit: Payer: Self-pay

## 2018-10-22 ENCOUNTER — Other Ambulatory Visit (HOSPITAL_COMMUNITY): Payer: 59 | Admitting: Psychology

## 2018-10-22 DIAGNOSIS — Z8659 Personal history of other mental and behavioral disorders: Secondary | ICD-10-CM

## 2018-10-22 DIAGNOSIS — G20 Parkinson's disease: Secondary | ICD-10-CM

## 2018-10-22 DIAGNOSIS — F1020 Alcohol dependence, uncomplicated: Secondary | ICD-10-CM

## 2018-10-22 NOTE — Progress Notes (Signed)
Virtual Visit via Video Note  I connected with Kenneth Solomon on 10/22/18 at  1:00 PM EDT by a video enabled telemedicine application and verified that I am speaking with the correct person using two identifiers.   I discussed the limitations of evaluation and management by telemedicine and the availability of in person appointments. The patient expressed understanding and agreed to proceed.     I discussed the assessment and treatment plan with the patient. The patient was provided an opportunity to ask questions and all were answered. The patient agreed with the plan and demonstrated an understanding of the instructions.   The patient was advised to call back or seek an in-person evaluation if the symptoms worsen or if the condition fails to improve as anticipated.  I provided 180 minutes of non-face-to-face time during this encounter.   [name], LCAS-A     Daily Group Progress Note  Program: CD-IOP   10/22/2018 Kenneth Solomon [id number]  Diagnosis:  Alcohol use disorder, severe, dependence (HCC)  History of posttraumatic stress disorder (PTSD)  Parkinson disease (HCC)  Stage of Change: Action  Sobriety Date: 2/28  Group Time: 1-2:30  Participation Level: Active  Behavioral Response: Appropriate and Sharing  Type of Therapy: Process Group  Interventions: CBT  Topic: PTs were active and engaged in virtual group processing session. Counselor facilitated CBT-based and emotion-focused processing questions to help PTs discuss their recovery from mind-altering drugs/alcohol. Emphasis was placed on PTs disclosing their challenges and successes pertaining to their treatment plan goals. PTs were asked to discuss their specific plans for upcoming weekend. PTs were challenged to read the "Doctor's Opinion" and plan to discuss it on Monday.      Group Time: 2:30-4  Participation Level: Active  Behavioral Response: Appropriate and Sharing  Type of Therapy:  Psycho-education Group  Interventions: CBT and Psychosocial Skills: Healthy ADL's  Topic: PTs were led in a 1 hour virtual psychoeducation session w/ guest speaker Enriqueta Shutter. PTs were taught about Sleep, Diet, and Exercise as pertaining to Early Sobriety and Recovery. PTs learned about sleep hygiene, stress reduction techniques, and how to recognize sxs of unhealthy ADL's.    Summary: PT was open and engaged in session. He reported he spent time w/ his family. He plans to open up his boundaries w/ his brother by helping his brother around his house w/ work. PT was encouraging to other members and offered helpful and supportive, empathic feedback for others.   UDS collected: No Results: negative  AA/NA attended?: No  Sponsor?: No  [name], LCMHCA, LCASA 10/22/2018 3:10 PM

## 2018-10-26 ENCOUNTER — Encounter (HOSPITAL_COMMUNITY): Payer: Self-pay | Admitting: Psychology

## 2018-10-26 ENCOUNTER — Other Ambulatory Visit: Payer: Self-pay

## 2018-10-26 ENCOUNTER — Other Ambulatory Visit (HOSPITAL_COMMUNITY): Payer: 59 | Admitting: Psychology

## 2018-10-26 DIAGNOSIS — F1020 Alcohol dependence, uncomplicated: Secondary | ICD-10-CM

## 2018-10-26 DIAGNOSIS — Z8659 Personal history of other mental and behavioral disorders: Secondary | ICD-10-CM

## 2018-10-26 NOTE — Progress Notes (Signed)
    Daily Group Progress Note  Program: CD-IOP   10/21/2018 Kenneth Solomon [id number]  Diagnosis: Alcohol Use Disorder, Severe, Parkinson's Disease, Chronic PTSD  Sobriety Date: 08/28/18  Group Time: 1-2:30pm  Participation Level: Active  Behavioral Response: Sharing  Type of Therapy: Process Group  Interventions: Supportive  Topic: Process: The first part of group began with process. Group members shared their experiences in recovery since we last met on Monday. All group members were present. Five group members attended the meeting via [place] and two group members attended the meeting in person. Two drug tests were collected and the Medical Director met with three group members.   Group Time: 2:30-4pm  Participation Level: Active  Behavioral Response: Sharing  Type of Therapy: Psycho-education Group  Interventions: Assertiveness Training  Topic: Psycho-ed: The second half of group was spent in psycho-ed discussing different types of boundaries and different situations where boundaries are necessary. A handout was distributed to group members who took turns reading the information aloud. Group members engaged in a conversation about their own experiences with boundaries. A graduation ceremony was held in the last 15 minutes of group.   Summary: The patient attended the group via [place]. Patient reported attendance at one in-person AA meeting since we last met. The patient shared that his shining moment of the week was stopping a medication that he and his doctor felt was no longer necessary and going for daily walks. The patient processed struggles with early morning depression and difficulty "getting going." The patient was engaged during the process portion of group and offered supportive feedback to fellow group members. During the psycho-ed portion of group, the patient was active and participated in the group discussion about boundaries.    UDS collected: No   AA/NA  attended?: YesTuesday  Sponsor?: No   Brandon Melnick, LCAS 10/26/2018 10:14 AM

## 2018-10-27 ENCOUNTER — Encounter (HOSPITAL_COMMUNITY): Payer: Self-pay | Admitting: Psychology

## 2018-10-27 NOTE — Progress Notes (Signed)
  Virtual Visit via Video Note  I connected with Kenneth Solomon on 10/26/18 at  1:00 PM EDT by a video enabled telemedicine application and verified that I am speaking with the correct person using two identifiers.   I discussed the limitations of evaluation and management by telemedicine and the availability of in person appointments. The patient expressed understanding and agreed to proceed.     I discussed the assessment and treatment plan with the patient. The patient was provided an opportunity to ask questions and all were answered. The patient agreed with the plan and demonstrated an understanding of the instructions.   The patient was advised to call back or seek an in-person evaluation if the symptoms worsen or if the condition fails to improve as anticipated.  I provided 180 minutes of non-face-to-face time during this encounter.   [name], LCAS-A    Daily Group Progress Note  Program: CD-IOP   10/27/2018 Kenneth Solomon [id number]  Diagnosis:  Alcohol use disorder, severe, dependence (HCC)  History of posttraumatic stress disorder (PTSD)  Stage of Change:Planning, Action  Sobriety Date: 2/28  Group Time: 1-2:30  Participation Level: Active  Behavioral Response: Appropriate and Sharing  Type of Therapy: Process Group  Interventions: CBT  Topic: PTs were active and engaged in virtual group processing session. Counselor facilitated CBT-based and emotion-focused processing questions to help PTs discuss their recovery from mind-altering drugs/alcohol. Emphasis was placed on PTs disclosing their challenges and successes pertaining to their treatment plan goals. PTs discussed f/u questions to the reading that was assigned in previous session on "The Doctor's Opinion". Discussed the chronic, biological, and spiritual nature of addiction.      Group Time: 2:30-4  Participation Level: Active  Behavioral Response: Appropriate and Sharing  Type of Therapy:  Psycho-education Group  Interventions: CBT and Psychosocial Skills: Boundary Setting  Topic: PTs were led in a 1 hour virtual psychoeducation session on "Boundary Setting". A handout was provided virtually via email and screen sharing. PTs were asked to read various sections and comment on their personal experience w/ boundaries. PTs were challenged to practice assertive communication and clearly stating "no" in different ways.    Summary: PT was active and engaged in virtual group session. He reported he attended 2 AA meetings since last group. He states his appetite is back up since stopping Naltrexone. He is journaling his early-morning anxious thoughts which is helping. PT was receptive and provided good feedback for others about boundaries w/ family members.   UDS collected: No Results: negative  AA/NA attended?: YesMonday and Saturday  Sponsor?: No   [name], [facility], LCASA 10/27/2018 3:15 PM

## 2018-10-28 ENCOUNTER — Other Ambulatory Visit (HOSPITAL_COMMUNITY): Payer: 59 | Admitting: Psychology

## 2018-10-28 ENCOUNTER — Other Ambulatory Visit: Payer: Self-pay

## 2018-10-28 DIAGNOSIS — Z8659 Personal history of other mental and behavioral disorders: Secondary | ICD-10-CM

## 2018-10-28 DIAGNOSIS — F1020 Alcohol dependence, uncomplicated: Secondary | ICD-10-CM | POA: Diagnosis not present

## 2018-10-28 DIAGNOSIS — G20 Parkinson's disease: Secondary | ICD-10-CM

## 2018-10-29 ENCOUNTER — Ambulatory Visit: Payer: 59 | Admitting: Family Medicine

## 2018-10-29 ENCOUNTER — Other Ambulatory Visit (HOSPITAL_COMMUNITY): Payer: 59 | Admitting: Psychology

## 2018-10-29 ENCOUNTER — Other Ambulatory Visit: Payer: Self-pay

## 2018-10-29 ENCOUNTER — Encounter (HOSPITAL_COMMUNITY): Payer: Self-pay

## 2018-10-29 DIAGNOSIS — F1020 Alcohol dependence, uncomplicated: Secondary | ICD-10-CM | POA: Diagnosis not present

## 2018-10-29 DIAGNOSIS — G20 Parkinson's disease: Secondary | ICD-10-CM

## 2018-10-29 DIAGNOSIS — F19980 Other psychoactive substance use, unspecified with psychoactive substance-induced anxiety disorder: Secondary | ICD-10-CM

## 2018-10-29 NOTE — Progress Notes (Signed)
  Virtual Visit via Video Note  I connected with Kenneth Solomon on 10/29/18 at  1:00 PM EDT by a video enabled telemedicine application and verified that I am speaking with the correct person using two identifiers.  Location: Patient: Home Provider: Office   I discussed the limitations of evaluation and management by telemedicine and the availability of in person appointments. The patient expressed understanding and agreed to proceed.     I discussed the assessment and treatment plan with the patient. The patient was provided an opportunity to ask questions and all were answered. The patient agreed with the plan and demonstrated an understanding of the instructions.   The patient was advised to call back or seek an in-person evaluation if the symptoms worsen or if the condition fails to improve as anticipated.  I provided 180 minutes of non-face-to-face time during this encounter.   [name], LCAS-A    Daily Group Progress Note  Program: CD-IOP   10/29/2018 Kenneth Solomon [id number]  Diagnosis:  Alcohol use disorder, severe, dependence (HCC)  Parkinson disease (HCC)  Substance-induced anxiety disorder (HCC)  Stage of Change: Action  Sobriety Date: 2/28  Group Time: 1-2:30  Participation Level: Active  Behavioral Response: Appropriate and Sharing  Type of Therapy: Process Group  Interventions: CBT  Topic: PTs were active and engaged in virtual group processing session. Counselor facilitated CBT-based and emotion-focused processing questions to help PTs discuss their recovery from mind-altering drugs/alcohol. Emphasis was placed on PTs disclosing their challenges and successes pertaining to their treatment plan goals. PTs discussed f/u from their commitments yesterday to "exercise vulnerability". All PTs had participated in their homework and shared w/ others about how it helped them.      Group Time: 2:30-4  Participation Level: Active  Behavioral  Response: Appropriate and Sharing  Type of Therapy: Psycho-education Group  Interventions: Psychosocial Skills: Gratitude  Topic: PTs were led in a 1 hour virtual psychoeducation session on Gratitude. PTs were asked to recall a person who said or did something that changed their life. PTs were then asked to pen a letter to the said person and describe how it changed them. Lesson was provided from a stages of change model Group Substance Abuse treatment manual by [employer]. Pts shared openly about their experiences. Some members became tearful others showed blunted affect when sharing. PTs shared their parting words for the outgoing counseling intern who graduated and will not be returning to group after today.    Summary: PT was active, engaged, and made strong eye contact throughout video group session. He reported on his homework of discussing 3 things he is proud of. He chose to discuss running in 4 marathons, raising good children, and reaching captain of police. PT continues to become tearful when he discusses his past gratitudes. He offered helpful and supportive feedback for another group member who was struggling to share angry emotions. PT discussed his gratitude for a person he mentored in the past who looked to him "like a father".   UDS collected: Yes Results: Pending  AA/NA attended?: No  Sponsor?: No   [name], [facility], LCASA 10/29/2018 4:30 PM

## 2018-11-02 ENCOUNTER — Encounter (HOSPITAL_COMMUNITY): Payer: Self-pay | Admitting: Psychology

## 2018-11-02 ENCOUNTER — Other Ambulatory Visit (HOSPITAL_COMMUNITY): Payer: 59 | Attending: Psychiatry | Admitting: Psychology

## 2018-11-02 ENCOUNTER — Other Ambulatory Visit: Payer: Self-pay

## 2018-11-02 DIAGNOSIS — K7010 Alcoholic hepatitis without ascites: Secondary | ICD-10-CM | POA: Insufficient documentation

## 2018-11-02 DIAGNOSIS — F341 Dysthymic disorder: Secondary | ICD-10-CM | POA: Diagnosis not present

## 2018-11-02 DIAGNOSIS — F19980 Other psychoactive substance use, unspecified with psychoactive substance-induced anxiety disorder: Secondary | ICD-10-CM

## 2018-11-02 DIAGNOSIS — Z87442 Personal history of urinary calculi: Secondary | ICD-10-CM | POA: Diagnosis not present

## 2018-11-02 DIAGNOSIS — F4310 Post-traumatic stress disorder, unspecified: Secondary | ICD-10-CM | POA: Insufficient documentation

## 2018-11-02 DIAGNOSIS — F1994 Other psychoactive substance use, unspecified with psychoactive substance-induced mood disorder: Secondary | ICD-10-CM | POA: Diagnosis not present

## 2018-11-02 DIAGNOSIS — I10 Essential (primary) hypertension: Secondary | ICD-10-CM | POA: Insufficient documentation

## 2018-11-02 DIAGNOSIS — F411 Generalized anxiety disorder: Secondary | ICD-10-CM | POA: Diagnosis not present

## 2018-11-02 DIAGNOSIS — Z6372 Alcoholism and drug addiction in family: Secondary | ICD-10-CM | POA: Insufficient documentation

## 2018-11-02 DIAGNOSIS — Z79899 Other long term (current) drug therapy: Secondary | ICD-10-CM | POA: Insufficient documentation

## 2018-11-02 DIAGNOSIS — F1020 Alcohol dependence, uncomplicated: Secondary | ICD-10-CM | POA: Insufficient documentation

## 2018-11-02 DIAGNOSIS — G20 Parkinson's disease: Secondary | ICD-10-CM | POA: Insufficient documentation

## 2018-11-02 DIAGNOSIS — E785 Hyperlipidemia, unspecified: Secondary | ICD-10-CM | POA: Diagnosis not present

## 2018-11-02 DIAGNOSIS — F1311 Sedative, hypnotic or anxiolytic abuse, in remission: Secondary | ICD-10-CM | POA: Insufficient documentation

## 2018-11-02 NOTE — Progress Notes (Signed)
    Daily Group Progress Note  Program: CD-IOP   10/28/2018 Kenneth Solomon [id number]  Diagnosis: Alcohol Use Disorder, Severe, Chronic PTSD, Parkinson's Disease  Sobriety Date: 08/28/2018  Group Time: 1-2:30pm  Participation Level: Active  Behavioral Response: Appropriate  Type of Therapy: Process Group  Interventions: Supportive  Topic: Process: The first part of group began with process. Group members shared their experiences in recovery since we last met on Monday. All group members were present via [place]. The Medical Director met with two group members to complete medication checks.   Group Time: 2:30-4pm  Participation Level: Active  Behavioral Response: Appropriate  Type of Therapy: Psycho-education Group  Interventions: Psychosocial Skills: Vulnerability  Topic: Psycho-ed: Vulnerability; The second half of group was spent in psycho-ed discussing vulnerability. The [employer] TED talk was sent to group members to independently watch on YouTube. Afterwards, group members engaged in a discussion about the importance of connection and the role connection plays in early recovery.   Summary: The patient attended the group via [place]. Patient reported attendance at one in-person AA meeting since we last met. The patient reported that he was able to get an in-person doctor's appointment to discuss his Parkinson's disease. The patient processed a decrease in his morning anxiety and how new coping skills have contributed to his calmer emotional state.  The patient was alert and engaged during process and offered supportive feedback to fellow group members. During the psycho-ed portion of group, the patient remained active and engaged. The patient offered insightful commentary that facilitated group discussion surrounding connection and vulnerability.   UDS collected: No  AA/NA attended?: [place]  Sponsor?: No, but he has been strongly encouraged to secure a temporary sponsor  asap   Kenneth Solomon, [place] 11/02/2018 9:35 AM

## 2018-11-03 ENCOUNTER — Ambulatory Visit (HOSPITAL_COMMUNITY): Payer: 59 | Admitting: Licensed Clinical Social Worker

## 2018-11-03 ENCOUNTER — Other Ambulatory Visit: Payer: Self-pay

## 2018-11-03 ENCOUNTER — Encounter (HOSPITAL_COMMUNITY): Payer: Self-pay | Admitting: Licensed Clinical Social Worker

## 2018-11-03 DIAGNOSIS — F1020 Alcohol dependence, uncomplicated: Secondary | ICD-10-CM

## 2018-11-03 DIAGNOSIS — F19980 Other psychoactive substance use, unspecified with psychoactive substance-induced anxiety disorder: Secondary | ICD-10-CM

## 2018-11-03 DIAGNOSIS — G20 Parkinson's disease: Secondary | ICD-10-CM

## 2018-11-03 NOTE — Progress Notes (Signed)
Virtual Visit via Video Note  I connected with Kenneth Solomon on 11/03/18 at  1:30 PM EDT by a video enabled telemedicine application and verified that I am speaking with the correct person using two identifiers.  Location: Patient: Home Provider: Home   I discussed the limitations of evaluation and management by telemedicine and the availability of in person appointments. The patient expressed understanding and agreed to proceed.     I discussed the assessment and treatment plan with the patient. The patient was provided an opportunity to ask questions and all were answered. The patient agreed with the plan and demonstrated an understanding of the instructions.   The patient was advised to call back or seek an in-person evaluation if the symptoms worsen or if the condition fails to improve as anticipated.  I provided 50 minutes of non-face-to-face time during this encounter.   Archie Balboa, LCAS-A    THERAPIST PROGRESS NOTE  Session Time: 1:30-2:30  Participation Level: Active  Behavioral Response: Well GroomedAlertEuthymic  Type of Therapy: Individual Therapy  Treatment Goals addressed: Anxiety and Diagnosis: Alcohol Use Disorder  Interventions: CBT and Psychosocial Skills: Codependency and boundary setting  Summary: Kenneth Solomon is a 63 y.o. male who presents with Alcohol Use Disorder, Severe and hx of Anxiety. He is engaged and expresses concern for his adult daughter during session. PT states his adult daughter is considering getting separated from her husband of 14 years (with whom PT is currently living). PT asks me about how he can be a "good father and offer support while still staying sober". PT prioritizes his own recovery but also recognizes ways that he can help his daughter get in touch w/ her own feelings and needs at this time.    Suicidal/Homicidal: Nowithout intent/plan  Therapist Response: I used open questions, active listening, and reflection. I worked  to help PT identify his options for how to handle the ongoing conflict in his home and how he can respond when he is asked to provide support. I helped PT identify how to recognize when his own needs are not being met.  Plan: Continue CD-IOP  Diagnosis:    ICD-10-CM   1. Alcohol use disorder, severe, dependence ([place]) F10.20   2. Parkinson disease ([place]) G20   3. Substance-induced anxiety disorder [facility]) F19.980        Archie Balboa, LCAS-A 11/03/2018

## 2018-11-04 ENCOUNTER — Encounter (HOSPITAL_COMMUNITY): Payer: Self-pay | Admitting: Psychology

## 2018-11-04 ENCOUNTER — Other Ambulatory Visit (HOSPITAL_COMMUNITY): Payer: 59 | Admitting: Psychology

## 2018-11-04 DIAGNOSIS — G20 Parkinson's disease: Secondary | ICD-10-CM

## 2018-11-04 DIAGNOSIS — F19980 Other psychoactive substance use, unspecified with psychoactive substance-induced anxiety disorder: Secondary | ICD-10-CM

## 2018-11-04 DIAGNOSIS — F1020 Alcohol dependence, uncomplicated: Secondary | ICD-10-CM

## 2018-11-04 NOTE — Progress Notes (Signed)
Virtual Visit via Video Note  I connected with Kenneth Solomon on 11/02/18 at  1:00 PM EDT by a video enabled telemedicine application and verified that I am speaking with the correct person using two identifiers.  Location: Patient: Home Provider: Office   I discussed the limitations of evaluation and management by telemedicine and the availability of in person appointments. The patient expressed understanding and agreed to proceed.     I discussed the assessment and treatment plan with the patient. The patient was provided an opportunity to ask questions and all were answered. The patient agreed with the plan and demonstrated an understanding of the instructions.   The patient was advised to call back or seek an in-person evaluation if the symptoms worsen or if the condition fails to improve as anticipated.  I provided 180 minutes of non-face-to-face time during this encounter.   [name], LCAS-A     Daily Group Progress Note  Program: CD-IOP   11/04/2018 Kenneth Solomon [id number]  Diagnosis:  Alcohol use disorder, severe, dependence (HCC)  Parkinson disease (HCC)  Substance-induced anxiety disorder (HCC)  Stage of Change: Planning, Action  Sobriety Date: 2/28  Group Time: 1-2:30  Participation Level: Active  Behavioral Response: Appropriate and Sharing  Type of Therapy: Process Group  Interventions: CBT  Topic: PTs were active and engaged in virtual group processing session. Counselor facilitated CBT-based and emotion-focused processing questions to help PTs discuss their recovery from mind-altering drugs/alcohol. Emphasis was placed on PTs disclosing their challenges and successes pertaining to their treatment plan goals. PTs celebrated the graduation of one member.      Group Time: 2:30-4  Participation Level: Active  Behavioral Response: Appropriate and Sharing  Type of Therapy: Psycho-education Group  Interventions: CBT and Meditation: Chair  Yoga  Topic: PTs were led in a 1 hour virtual psychoeducation session on Chair Yoga by Forde Radon, [facility]. PTs were active and engaged and reported feeling relaxed and "more centered" following the session. Poses were explained and demonstrated and PTs were encouraged to modify poses as they need. PTs were led in a Motivational Interviewing style Pros/Cons list and asked to create a quadraint system of the reasons to stop and continue using.    Summary: PT was active and engaged in session. He reported he attended 2 AA meetings, one in person one virtual. He admitted he was frustrated due to [employer] not following through on their appointment call on Friday 10/30/18. He spent time over weekend doing yardwork and is walking daily w/ family members. PT was agitated in session and mildly tearful when relaying that his adult daughter (w/ whom he lives) might be seeking separation from her husband due to ongoing conflict that appears to be unreconcileable. PT admits he is having small thoughts of drinking but they are "easy to dismiss". He wants to talk more about his daughter's situation in our one-on-one.    UDS collected: No Results: negative 4/28  AA/NA attended?: [place] and Friday  Sponsor?: No   [name], [facility], LCASA 11/04/2018 4:57 PM

## 2018-11-05 ENCOUNTER — Other Ambulatory Visit (HOSPITAL_COMMUNITY): Payer: 59 | Admitting: Psychology

## 2018-11-05 ENCOUNTER — Encounter (HOSPITAL_COMMUNITY): Payer: Self-pay | Admitting: Psychology

## 2018-11-05 ENCOUNTER — Other Ambulatory Visit: Payer: Self-pay

## 2018-11-05 DIAGNOSIS — G20 Parkinson's disease: Secondary | ICD-10-CM

## 2018-11-05 DIAGNOSIS — F19980 Other psychoactive substance use, unspecified with psychoactive substance-induced anxiety disorder: Secondary | ICD-10-CM

## 2018-11-05 DIAGNOSIS — F1020 Alcohol dependence, uncomplicated: Secondary | ICD-10-CM

## 2018-11-05 NOTE — Progress Notes (Signed)
  Virtual Visit via Video Note  I connected with Kenneth Solomon on 11/04/18 at  1:00 PM EDT by a video enabled telemedicine application and verified that I am speaking with the correct person using two identifiers.  Location: Patient: Home Provider: Office   I discussed the limitations of evaluation and management by telemedicine and the availability of in person appointments. The patient expressed understanding and agreed to proceed.     I discussed the assessment and treatment plan with the patient. The patient was provided an opportunity to ask questions and all were answered. The patient agreed with the plan and demonstrated an understanding of the instructions.   The patient was advised to call back or seek an in-person evaluation if the symptoms worsen or if the condition fails to improve as anticipated.  I provided 180 minutes of non-face-to-face time during this encounter.   [name], LCAS-A    Daily Group Progress Note  Program: CD-IOP   11/05/2018 Kenneth Solomon [id number]  Diagnosis:  Alcohol use disorder, severe, dependence (HCC)  Parkinson disease (HCC)  Substance-induced anxiety disorder (HCC)  Stage of Change: Action  Sobriety Date: 3/28  Group Time: 1-2:30  Participation Level: Active  Behavioral Response: Appropriate and Sharing  Type of Therapy: Process Group  Interventions: CBT and Strength-based  Topic: PTs were active and engaged in virtual group processing session. Counselor facilitated CBT-based and emotion-focused processing questions to help PTs discuss their recovery from mind-altering drugs/alcohol. Emphasis was placed on PTs disclosing their challenges and successes pertaining to their treatment plan goals. One PT discussed a sensitive issue of his feelings towards being adopted and asked the group for feedback on how to address his questions w/ his family. UDS were collected for all but one member who was unable to urinate after 1.5  hours.      Group Time: 2:30-4  Participation Level: Active  Behavioral Response: Appropriate and Sharing  Type of Therapy: Psycho-education Group  Interventions: CBT and Meditation: Mindfulness, Thought Labeling  Topic: PTs were led in a 1 hour virtual psychoeducation session w/ a lesson on CBT based "Thought Labeling". Counselor first led a 10 min though labeling meditation and processed afterward. Then counselor shared document on "Socractic Questioning" and then guided all members through a self-inquiry process that emphasized reframing and challenging automatic negative thoughts.     Summary: PT was active, engaged in virtual group session. He had attempted to provide UDS in our office earlier that day but was unable to urinate after 1.5 hours and was asked to return Thursday to provide sample. PT attended 1 virtual AA meeting since last group. PT updated group on the emotional status of his household due to daughter's possible separation. PT was mostly upbeat and stated he continues to exercise regularly by getting out to walk. PT's negative thought he explored was "person will not listen to my advice". He was able to challenge this and reframe his thinking using CBT.   UDS collected: No Results: Could not provide UDS, asked to return tomorrow.  AA/NA attended?: YesWednesday  Sponsor?: No   [name], [facility], LCASA 11/05/2018 9:51 AM

## 2018-11-06 ENCOUNTER — Other Ambulatory Visit: Payer: Self-pay

## 2018-11-06 ENCOUNTER — Ambulatory Visit (INDEPENDENT_AMBULATORY_CARE_PROVIDER_SITE_OTHER): Payer: 59 | Admitting: Family Medicine

## 2018-11-06 ENCOUNTER — Encounter: Payer: Self-pay | Admitting: Family Medicine

## 2018-11-06 VITALS — HR 64 | Resp 12

## 2018-11-06 DIAGNOSIS — I10 Essential (primary) hypertension: Secondary | ICD-10-CM | POA: Diagnosis not present

## 2018-11-06 DIAGNOSIS — G20 Parkinson's disease: Secondary | ICD-10-CM

## 2018-11-06 DIAGNOSIS — G4700 Insomnia, unspecified: Secondary | ICD-10-CM

## 2018-11-06 DIAGNOSIS — E785 Hyperlipidemia, unspecified: Secondary | ICD-10-CM

## 2018-11-06 DIAGNOSIS — R251 Tremor, unspecified: Secondary | ICD-10-CM

## 2018-11-06 MED ORDER — PROPRANOLOL HCL 40 MG PO TABS: 20.0000 mg | ORAL_TABLET | Freq: Two times a day (BID) | ORAL | 1 refills | Status: DC

## 2018-11-06 MED ORDER — ESCITALOPRAM OXALATE 10 MG PO TABS: 10.0000 mg | ORAL_TABLET | Freq: Every day | ORAL | 1 refills | Status: DC

## 2018-11-06 MED ORDER — CARBIDOPA-LEVODOPA ER 25-100 MG PO TBCR: EXTENDED_RELEASE_TABLET | ORAL | 2 refills | Status: DC

## 2018-11-06 MED ORDER — OLMESARTAN MEDOXOMIL 20 MG PO TABS: 20.0000 mg | ORAL_TABLET | Freq: Every day | ORAL | 1 refills | Status: DC

## 2018-11-06 MED ORDER — TRAZODONE HCL 50 MG PO TABS: 50.0000 mg | ORAL_TABLET | Freq: Every evening | ORAL | 1 refills | Status: DC | PRN

## 2018-11-06 NOTE — Assessment & Plan Note (Signed)
Problem has been overall stable, no changes in current management. Fall precautions discussed. Neurology referral placed.

## 2018-11-06 NOTE — Progress Notes (Signed)
Virtual Visit via Video Note   I connected with [name] and his daughter on 11/06/18 at  8:30 AM EDT by a video enabled telemedicine application and verified that I am speaking with the correct person using two identifiers.  Location patient: home Location provider:work office Persons participating in the virtual visit: patient, provider  I discussed the limitations of evaluation and management by telemedicine and the availability of in person appointments. He expressed understanding and agreed to proceed.   HPI: [name] is 63 yo male who is bing seen today to establish care. Former PCP: Dr. Allene Dillonorres. Last CPE: 05/2018. He has history of hypertension, hyperlipidemia, alcoholism, Parkinson disease with significant tremor, anxiety, depression, and nephrolithiasis among some.   Denies  Hypertension:  Currently on Benicar 20 mg daily. He does not check BP periodically but usually it is well controlled when he goes for office visits.  He is taking medications as instructed, no side effects reported.  He has not noted unusual headache, visual changes, exertional chest pain, dyspnea,  focal weakness, or edema.  Tremor: Currently he is on propranolol 40 mg daily.  According to patient, higher doses of propranolol caused bradycardia. He has had problem for years.  HLD: Taking atorvastatin 20 mg daily. Last lipid panel on 06/08/2018: LDL 183, TC 236, TG 196, and HDL 46. Glucose has been elevated at 121 on 19 03/20/2018, in the past 115 and 116. No history of DM.  Last A1c normal at 5.5 (5.9)  Parkinson disease: He was following with Dr. Leanna BattlesMalone, last follow-up was in 03/2018. He has had a few falls in the past year, last one a few weeks ago, he tripped with computer cord, no serious injury. He does not need assistance with walking.  Alcoholism: Attending AA meetings 3 times per week. Today he has been sober for 66 days. He lives with his daughter.  Depression,anxiety,and insomnia:  He is on Lexapro 10 mg daily. According to records,he was on Ativan. Denies suicidal thoughts.  Parkinson disease contributed for his depression. Trazodone 50 mg at bedtime helps him sleep. Sleeps about 8 hours.  He feels like meds are helping,no side effects reported.   ROS: See pertinent positives and negatives per HPI.  Past Medical History:  Diagnosis Date  . Alcoholism /alcohol abuse [facility](HCC)    Attending AA meetings.  . Anxiety   . Depression   . Hyperlipidemia   . Hypertension     History reviewed. No pertinent surgical history.  Family History  Problem Relation Age of Onset  . Hyperlipidemia Mother   . Hypertension Mother   . Stroke Mother   . Heart disease Father   . Cancer Sister        Leukemia  . Heart disease Paternal Aunt      Social History   Socioeconomic History  . Marital status: Divorced    Spouse name: Not on file  . Number of children: Not on file  . Years of education: Not on file  . Highest education level: Not on file  Occupational History  . Not on file  Social Needs  . Financial resource strain: Not on file  . Food insecurity:    Worry: Not on file    Inability: Not on file  . Transportation needs:    Medical: Not on file    Non-medical: Not on file  Tobacco Use  . Smoking status: Never Smoker  . Smokeless tobacco: Current User    Types: Snuff  Substance and Sexual  Activity  . Alcohol use: Not Currently  . Drug use: Not on file  . Sexual activity: Not on file  Lifestyle  . Physical activity:    Days per week: Not on file    Minutes per session: Not on file  . Stress: Not on file  Relationships  . Social connections:    Talks on phone: Not on file    Gets together: Not on file    Attends religious service: Not on file    Active member of club or organization: Not on file    Attends meetings of clubs or organizations: Not on file    Relationship status: Not on file  . Intimate partner violence:    Fear of current or ex  partner: Not on file    Emotionally abused: Not on file    Physically abused: Not on file    Forced sexual activity: Not on file  Other Topics Concern  . Not on file  Social History Narrative  . Not on file     Current Outpatient Medications:  .  traZODone (DESYREL) 50 MG tablet, Take 1 tablet (50 mg total) by mouth at bedtime as needed for sleep., Disp: 90 tablet, Rfl: 1 .  atorvastatin (LIPITOR) 40 MG tablet, take 1 tablet by mouth once daily, Disp: , Rfl:  .  Carbidopa-Levodopa ER (SINEMET CR) 25-100 MG tablet controlled release, 1 po at 800, noon, 4 pm, Disp: 90 tablet, Rfl: 2 .  escitalopram (LEXAPRO) 10 MG tablet, Take 1 tablet (10 mg total) by mouth daily., Disp: 90 tablet, Rfl: 1 .  Multiple Vitamin (MULTIVITAMIN) tablet, Take by mouth., Disp: , Rfl:  .  olmesartan (BENICAR) 20 MG tablet, Take 1 tablet (20 mg total) by mouth daily., Disp: 90 tablet, Rfl: 1 .  propranolol (INDERAL) 40 MG tablet, Take 0.5 tablets (20 mg total) by mouth 2 (two) times daily. Take 1 tablet daily for temor, Disp: 90 tablet, Rfl: 1  EXAM:  VITALS per patient if applicable:Pulse 64   Resp 12   GENERAL: alert, oriented, appears well and in no acute distress  HEENT: atraumatic, conjunctiva clear, and no obvious facial abnormalities on inspection.  LUNGS: on inspection no signs of respiratory distress, breathing rate appears normal, no obvious gross SOB, gasping or wheezing  CV: no obvious cyanosis  MS: moves all visible extremities without noticeable abnormality  PSYCH/NEURO: pleasant and cooperative, no obvious depression or anxiety, speech and thought processing grossly intact.Hand tremor appreciated during visit.  ASSESSMENT AND PLAN:  Discussed the following assessment and plan:  Essential hypertension BP seems to be adequately controlled. Continue Benicar 20 mg daily. Continue low-salt diet. Recommend trying to monitor BP regularly. Follow-up in 5 months.  Hyperlipidemia No changes  to Lipitor 20 mg daily. Continue low-fat diet. We will plan on checking lipid panel next visit.  Parkinson disease (HCC) Problem has been overall stable, no changes in current management. Fall precautions discussed. Neurology referral placed.  Tremor Problem is not well controlled. We discussed some side effects of propranolol. He will try propranolol 40 mg 0.5 tablet a.m. and 0.5 tablet afternoon. Continue following with neurologist, referral placed.  Insomnia Problem is well controlled with trazodone 50 mg at bedtime. No changes in current management. Good sleep hygiene also recommended.     I discussed the assessment and treatment plan with the patient. He was provided an opportunity to ask questions and all were answered. The patient agreed with the plan and demonstrated an understanding of the  instructions.     Return in about 5 months (around 04/08/2019) for HTN,depression,anxiety,insomnia..     [place], MD

## 2018-11-06 NOTE — Assessment & Plan Note (Signed)
Problem is not well controlled. We discussed some side effects of propranolol. He will try propranolol 40 mg 0.5 tablet a.m. and 0.5 tablet afternoon. Continue following with neurologist, referral placed.

## 2018-11-06 NOTE — Assessment & Plan Note (Signed)
BP seems to be adequately controlled. Continue Benicar 20 mg daily. Continue low-salt diet. Recommend trying to monitor BP regularly. Follow-up in 5 months.

## 2018-11-06 NOTE — Assessment & Plan Note (Signed)
No changes to Lipitor 20 mg daily. Continue low-fat diet. We will plan on checking lipid panel next visit.

## 2018-11-06 NOTE — Assessment & Plan Note (Signed)
Problem is well controlled with trazodone 50 mg at bedtime. No changes in current management. Good sleep hygiene also recommended.

## 2018-11-09 ENCOUNTER — Other Ambulatory Visit: Payer: Self-pay

## 2018-11-09 ENCOUNTER — Other Ambulatory Visit (HOSPITAL_COMMUNITY): Payer: 59 | Admitting: Psychology

## 2018-11-09 ENCOUNTER — Encounter (HOSPITAL_COMMUNITY): Payer: Self-pay | Admitting: Psychology

## 2018-11-09 DIAGNOSIS — F19980 Other psychoactive substance use, unspecified with psychoactive substance-induced anxiety disorder: Secondary | ICD-10-CM

## 2018-11-09 DIAGNOSIS — F1020 Alcohol dependence, uncomplicated: Secondary | ICD-10-CM

## 2018-11-09 DIAGNOSIS — G20 Parkinson's disease: Secondary | ICD-10-CM

## 2018-11-09 NOTE — Progress Notes (Signed)
Virtual Visit via Video Note  I connected with Kenneth Solomon on 11/09/18 at  1:00 PM EDT by a video enabled telemedicine application and verified that I am speaking with the correct person using two identifiers.  Location: Patient: Home Provider: Home   I discussed the limitations of evaluation and management by telemedicine and the availability of in person appointments. The patient expressed understanding and agreed to proceed.     I discussed the assessment and treatment plan with the patient. The patient was provided an opportunity to ask questions and all were answered. The patient agreed with the plan and demonstrated an understanding of the instructions.   The patient was advised to call back or seek an in-person evaluation if the symptoms worsen or if the condition fails to improve as anticipated.  I provided 180 minutes of non-face-to-face time during this encounter.   Kenneth Solomon, LCAS-A     Daily Group Progress Note  Program: CD-IOP   11/09/2018 Kenneth Solomon [id number]  Diagnosis:  Alcohol use disorder, severe, dependence (HCC)  Parkinson disease (HCC)  Substance-induced anxiety disorder (HCC)  Stage of Change: ACtion  Sobriety Date: 2/28  Group Time: 1-2:30  Participation Level: Active  Behavioral Response: Appropriate and Sharing  Type of Therapy: Process Group  Interventions: CBT and Strength-based  Topic: PTs were active and engaged in virtual group processing session. Counselor facilitated CBT-based and emotion-focused processing questions to help PTs discuss their recovery from mind-altering drugs/alcohol. Emphasis was placed on PTs disclosing their challenges and successes pertaining to their treatment plan goals. One PT was absent due to starting a new job that interfered w/ time of group. Counselor led a 15 min mindfulness meditation and processed afterward.      Group Time: 2:30-4  Participation Level: Active  Behavioral Response:  Appropriate and Sharing  Type of Therapy: Psycho-education Group  Interventions: CBT and Psychosocial Skills: Self Compassion  Topic: PTs were led in a 1 hour virtual psychoeducation session on "Self Compassion". Members viewed a TED talk by Kenneth Rushing, PhD, which explained the improtance of self compassion for stress reduction and the dangers of too much self esteem. PT processed and asked questions about how this concept applies to getting sober, remaining hopeful through cravings and challenges of early recovery.    Summary: PT was active and engaged in virtual session today. He reported he attended 2 AA meetings since last group. He was happy to report that he has connected w/ Kenneth Solomon PCP and is scheduled for a f/u in July. He will meet w/ Neurologist next. PT feels good about meeting w/ Kenneth Solomon. PT reports his house is "somewhat calmer" and his daughter's relationship is in a better place than last week. PT shared that he intends to secure a sponsor soon at his [employer].   UDS collected: No Results: negative  AA/NA attended?: YesFriday  Sponsor?: No   Kenneth Solomon, [facility], LCASA 11/09/2018 4:41 PM

## 2018-11-09 NOTE — Progress Notes (Signed)
Patient ID: Kenneth Solomon, male   DOB: 13-Feb-1956, 63 y.o.   MRN: [id number]                                                                                                                                                                                                                                                                                                                           [facility] Outpatient                                                                                                                                                                     CD-IOP DISCHARGE NOTE   Date of Admission: 09/16/2018 Referall Provider: [facility] Date of Discharge: 11/11/2018 Sobriety Date: 08/27/2018 Admission Diagnosis: 1. Alcohol use disorder, severe, dependence (HCC) F10.20   2. 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 Acute alcoholic hepatitis K70.10   4. Substance induced mood disorder (HCC) F19.94   5. 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 Parkinson disease (HCC) G20   7. 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 Dysfunctional family due to alcoholism Z63.72   9. Dysthymia  F34.1   10. Anxiety state F41.1   11. Essential hypertension I10   12. Hyperlipidemia, unspecified hyperlipidemia type E78.5   13. History of kidney stones Z87.442     Course of Treatment:  [facility] Pt seeking treatment for Alcohol dependence after undergoing detoxification at [facility] 2/28-09/04/2018.At that time pt reported drinking 1/2 fifth of Vodka daily for 2 years. Prior to that he had been sober for about 2 1/2 years after 5 day detoxification at [facility] [place] for alcoholism. His relapse is related to the diagnosis of Parkinsons by Neurologist Dr Leary [place]oney in [place]. Pt also had history of Ativan dependency treated with Klonopin detox 08/25/18.He was  unaware of the Biological basis of addiction. Treatment Plan/Recommendations: Plan of Care: SUDs and Core issues [facility] CD IOP-see Counselor's individualized treatment program  Laboratory:  UDS per protocol  Psychotherapy: IOP Group;Individual;Family  Medications: see list-denies need for MAT  Routine PRN Medications:  Negative  Consultations: To establish with new Neurologist  Safety Concerns:  Falls/Return to use  Other:  Adult Child of Alcoholic / ? 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 He admitted that he "played the game" with police counselors around the PTSD he experienced convincing them he was OK without ever really processsing trauma. He was encouraged to use his counselor here to stop this and to begin to allow himself to honestly feel and deal with the traumatic events of his profession.Hwe was advised that these can be definite triggers to relapse  Pt attended 21 IOP Groups and additionally joined AA attending 4 groups/week he was able to connect with a live meeting as well as ZOOM. He acknowledges he failed to follow thru with Sponsor and working the 12 suggested steps as a daily way of life which he acknowledges.  He also had weekly sessions with Counselor and daughter was [facility] standpoint. Pt had moved to [place] where he relapsed before going [facility]. [place] live with daughter while undergoing treatment. He has decided to sell his house in [place] and relocate to [place] with support of family.  Pt also has significant Parkinson's which he failed to followup on during his relapse. His anxiety and tremor improved substantially with abstinence. He had not been following with Providers during his relapse.He has m now establishe withDr [place]Jordan Betty [facility] and he has a Neurology FU with Frazier Richardsebecca Tate DO Neurologist [facility].  He was prescribed  Lexapro as inpatient as well as Naltrexone and Trazodone. As he has remained abstinent and attende treatment/AA he has not had  Cravings and his sleep has improved to the point where he has been able to discontinue his Naltrexone and Trazodone. He continues to take Lexapro for his anxiety.PHQ 9 scores have been stable at 10-11 which is probably baseline for his untreated PTSD. GAD 7 had improved from score of 16 to 11.   Pt will graduate from CD IOP today " successful " with the understanding he has more to do to continue his recovery/prevent relapse.  Medications: atorvastatin 40 MG tablet  Commonly known as: LIPITOR  take 1 tablet by mouth once daily   Carbidopa-Levodopa ER 25-100 MG tablet controlled release  Commonly known as: SINEMET CR  1 po at 800, noon, 4 pm   escitalopram 10 MG tablet  Commonly known  asJudye Bos  Take 1 tablet (10 mg total) by mouth daily.   multivitamin tablet  Take by mouth.   olmesartan 20 MG tablet  Commonly known as: BENICAR  Take 1 tablet (20 mg total) by mouth daily.   propranolol 40 MG tablet  Commonly known      Discharge Diagnosis: 0 Alcohol use disorder, severe, dependence (HCC)  0 Sedative, hypnotic or anxiolytic abuse, in remission (HCC)  0 Parkinson disease (HCC)  0 History of posttraumatic stress disorder (PTSD)  0 Dysthymia  0 Anxiety state  0 Substance-induced anxiety disorder (HCC)  0 Substance induced mood disorder (HCC)  0 Dysfunctional family due to alcoholism  0 Acute alcoholic   Plan of Action to Address Continuing Problems:  Goals and Activities to Help Maintain Sobriety: 1. Stay away from people ,places and things that are triggers 2. Continue practicing Fair Fighting rules in interpersonal conflicts. 3. Continue alcohol and drug refusal skills and call on support systems 4. Attend AA/NA meetings AT LEAST as often as you use  5. Obtain a sponsor and continue with  home group in AA. 6. Return to [name] Counselor  Referrals:  [facility]   Next appointment:Dr Lurena Joiner Tat 11/16/2018  Prognosis: Good if he follows thru with relapse prevention program, Poor if he does not    Client has NOT participated in the development of this discharge plan and will receive a copy of this completed plan    Attempted WEBEX_Pt did not respond to invite and reminder

## 2018-11-10 ENCOUNTER — Encounter (HOSPITAL_COMMUNITY): Payer: Self-pay | Admitting: Psychology

## 2018-11-10 NOTE — Progress Notes (Signed)
Virtual Visit via Video Note  I connected with Kenneth Solomon on 11/05/18 at  1:00 PM EDT by a video enabled telemedicine application and verified that I am speaking with the correct person using two identifiers.  Location: Patient: Home Provider: Office   I discussed the limitations of evaluation and management by telemedicine and the availability of in person appointments. The patient expressed understanding and agreed to proceed.      I discussed the assessment and treatment plan with the patient. The patient was provided an opportunity to ask questions and all were answered. The patient agreed with the plan and demonstrated an understanding of the instructions.   The patient was advised to call back or seek an in-person evaluation if the symptoms worsen or if the condition fails to improve as anticipated.  I provided 180 minutes of non-face-to-face time during this encounter.   [name], LCAS-A     Daily Group Progress Note  Program: CD-IOP   11/10/2018 Kenneth Solomon [id number]  Diagnosis:  Alcohol use disorder, severe, dependence (HCC)  Parkinson disease (HCC)  Substance-induced anxiety disorder (HCC)  Stage of Change: Planning, Action  Sobriety Date: 2/28  Group Time: 1-2:30  Participation Level: Active  Behavioral Response: Appropriate and Sharing  Type of Therapy: Process Group  Interventions: CBT and Meditation: Mindfulness  Topic: PTs were active and engaged in virtual group processing session. Counselor facilitated CBT-based and emotion-focused processing questions to help PTs discuss their recovery from mind-altering drugs/alcohol. Emphasis was placed on PTs disclosing their challenges and successes pertaining to their treatment plan goals.  Counselor showed a CBT video from [employer] on "you are not your behavior", self acceptance.      Group Time: 2:30-4  Participation Level: Active  Behavioral Response: Appropriate and  Sharing  Type of Therapy: Psycho-education Group  Interventions: CBT  Topic: PTs were led in a 1 hour virtual psychoeducation session on CBT coping skills utilizing Ortencia Kick Katie's worsheet entitled "Data processing manager". PTs were instructed on how to analyze their thinking about others and reflect it back towards their own challenges and judgments of themselves.    Summary: PT was active and engaged in virtual session. He reported he attended no AA meetings since yesterday. PT was happy to report he secured a virtual appointment w/ [name]. PT spoke on topic of forgiveness and how much he cherished the forgiveness of his children for his negative consequences from alcoholism. PT struggled w/ CBT worksheet and strongly identified w/ his frustration towards another. He was able to see how "he views himself as a victim" and challenge this thinking.   UDS collected: Yes Results: negative 5/7  AA/NA attended?: No  Sponsor?: No   [name], [facility], LCASA 11/10/2018 9:39 AM

## 2018-11-11 ENCOUNTER — Other Ambulatory Visit (INDEPENDENT_AMBULATORY_CARE_PROVIDER_SITE_OTHER): Payer: 59 | Admitting: Psychology

## 2018-11-11 ENCOUNTER — Encounter (HOSPITAL_COMMUNITY): Payer: Self-pay | Admitting: Medical

## 2018-11-11 ENCOUNTER — Other Ambulatory Visit: Payer: Self-pay

## 2018-11-11 DIAGNOSIS — I10 Essential (primary) hypertension: Secondary | ICD-10-CM

## 2018-11-11 DIAGNOSIS — F1020 Alcohol dependence, uncomplicated: Secondary | ICD-10-CM

## 2018-11-11 DIAGNOSIS — F1311 Sedative, hypnotic or anxiolytic abuse, in remission: Secondary | ICD-10-CM

## 2018-11-11 DIAGNOSIS — Z87442 Personal history of urinary calculi: Secondary | ICD-10-CM

## 2018-11-11 DIAGNOSIS — F19980 Other psychoactive substance use, unspecified with psychoactive substance-induced anxiety disorder: Secondary | ICD-10-CM

## 2018-11-11 DIAGNOSIS — E785 Hyperlipidemia, unspecified: Secondary | ICD-10-CM

## 2018-11-11 DIAGNOSIS — F411 Generalized anxiety disorder: Secondary | ICD-10-CM

## 2018-11-11 DIAGNOSIS — F341 Dysthymic disorder: Secondary | ICD-10-CM

## 2018-11-11 DIAGNOSIS — Z8659 Personal history of other mental and behavioral disorders: Secondary | ICD-10-CM

## 2018-11-11 DIAGNOSIS — K7010 Alcoholic hepatitis without ascites: Secondary | ICD-10-CM

## 2018-11-11 DIAGNOSIS — Z6372 Alcoholism and drug addiction in family: Secondary | ICD-10-CM

## 2018-11-11 DIAGNOSIS — F1994 Other psychoactive substance use, unspecified with psychoactive substance-induced mood disorder: Secondary | ICD-10-CM

## 2018-11-11 DIAGNOSIS — G20 Parkinson's disease: Secondary | ICD-10-CM

## 2018-11-11 NOTE — Progress Notes (Signed)
OUTPATIENT THERAPIST DISCHARGE SUMMARY:  Kenneth Solomon    08/09/1955   DIAGNOSIS:    No diagnosis found.  ADMISSION DATE: 3/j18/20   DISCHARGE DATE:  09/11/18  REASON FOR DISCHARGE:  Successful completion of CD-IOP. Insurance ran out.  REASON FOR ADMISSION: Referred from [facility] after completing Alcohol Detox.  CHEMICAL USE HISTORY: PT reports a long hx of alcohol abuse. He had one previous alcohol detox in late 2015 at [facility]. The patient reported that he had maintained sobriety after his treatment in 2015. In December of 2018, the patient was diagnosed with Parkinson Disease. He reported that he returned to drinking shortly after the diagnosis. His drinking progressed and he was very quickly back to where he had been in 2015. The patient explained he had managed to keep his alcohol use a secret as his adult children live at least an hour or more from his home. However, he finally disclosed his growing problem with his daughter as he struggled in alcohol withdrawal.  FAMILY OF ORIGIN ISSUES: PT denies, stating his childhood was "[place]-like". He has been divorced 3 times. He is close w/ his adult children who are supportive.  PROGRESS IN TREATMENT: PT made good progress in tx. He maintained sobriety, attended at least 3-4 AA meetings weekly both virtually and in person. He opened up w/ his adult daughter more about his problem w/ alcohol and asked for her forgiveness. PT became a supportive, encouraging group member. He did not secure a sponsor while in tx but reported he wanted to establish one as soon as he D/C. All UDS were negative except for minor levels of benzodiazapine which we believe were false positives. 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   PROGNOSIS: Fair, PT has experience w/ recovery, appears more motivated and determined to sustain sobriety and has social support at home living w/ his  adult daughter.      Comments:  na  [name], [facility], LCASA

## 2018-11-12 ENCOUNTER — Other Ambulatory Visit: Payer: Self-pay

## 2018-11-12 ENCOUNTER — Other Ambulatory Visit (HOSPITAL_COMMUNITY): Payer: 59

## 2018-11-12 NOTE — Progress Notes (Signed)
Virtual Visit via Video Note The purpose of this virtual visit is to provide medical care while limiting exposure to the novel coronavirus.    Consent was obtained for video visit:  Yes.   Answered questions that patient had about telehealth interaction:  Yes.   I discussed the limitations, risks, security and privacy concerns of performing an evaluation and management service by telemedicine. I also discussed with the patient that there may be a patient responsible charge related to this service. The patient expressed understanding and agreed to proceed.  Pt location: Home Physician Location: office Name of referring provider:  [place], Betty G, MD I connected with Kenneth Solomon at patients initiation/request on 11/16/2018 at  9:00 AM EDT by video enabled telemedicine application and verified that I am speaking with the correct person using two identifiers. Pt MRN:  [id number] Pt DOB:  10-22-1955 Video Participants:  Kenneth Solomon;  Daughter [facility])    The consultation is for the evaluation of Parkinson's disease.  Numerous records have been reviewed, which took approximately 30 minutes of non-face-to-face time.  Records indicate that patient had longstanding tremor, but is an alcoholic and always attributed the tremor to alcohol withdrawal.  The patient consulted with Dr. Leanna Battles of neurology on May 01, 2016.  At that point in time, he reported sobriety and was noticing right upper extremity resting tremor.  Dr. Leanna Battles felt that the patient had Parkinson's disease, but treatment that was initiated was to increase the patient's Inderal LA to 60 mg and then later to 80 mg daily.  The patient reports today that this brought on bradycardia, and that it was later decreased to 40 mg daily (primary care just recently decreased the dose to 20 mg twice daily).  In August, 2018 the patient followed up with the nurse practitioner for neurology [facility]) and she treated the tremor with Ativan.   In February, 2019, levodopa was initiated, 1 tablet twice per day.  In September, 2019, the patient saw Dr. Leanna Battles and he increased the patient's levodopa to 1 tablet 3 times per day and increased the patient's Ativan to 1 mg twice per day.  This was the last time that the patient was seen by neurology.  The patient subsequently went to rehab again for alcohol intoxication.  He went to detox from August 28, 2018 September 04, 2018 for alcohol and Ativan dependency.  Specific Symptoms:  Tremor: Yes.  , R hand and a little tremor and weakness in the L leg Family hx of similar:  No. Voice: some weaker Sleep: sleeps well  Vivid Dreams:  No.  Acting out dreams:  No. Wet Pillows: No. Postural symptoms:  No.  Falls?  Yes.  , 2 falls in the last 6 months (bed on floor - mattress on floor - with one got tangled in bedspread and fell on mattress).   Bradykinesia symptoms: shorter stride length and slower with ambulation Loss of smell:  Yes.   Loss of taste:  No. Urinary Incontinence:  No. Difficulty Swallowing:  No. Handwriting, micrographia: Yes.   Trouble with ADL's:  No. (better since quit drinking)  Trouble buttoning clothing: No. Depression:  No. Memory changes:  No. Hallucinations:  No.  visual distortions: No. N/V:  No. Lightheaded:  Yes.  , just if stands too quickly  Syncope: No. Diplopia:  No. Dyskinesia:  No. Prior exposure to reglan/antipsychotics: No.  Neuroimaging of the brain has not previously been performed.    PREVIOUS MEDICATIONS: Sinemet; propranolol - 20 mg  bid  ALLERGIES:  No Known Allergies  CURRENT MEDICATIONS:  Outpatient Encounter Medications as of 11/16/2018  Medication Sig   atorvastatin (LIPITOR) 40 MG tablet take 1 tablet by mouth once daily   Carbidopa-Levodopa ER (SINEMET CR) 25-100 MG tablet controlled release 1 po at 800, noon, 4 pm   escitalopram (LEXAPRO) 10 MG tablet Take 1 tablet (10 mg total) by mouth daily.   Multiple Vitamin (MULTIVITAMIN)  tablet Take by mouth.   olmesartan (BENICAR) 20 MG tablet Take 1 tablet (20 mg total) by mouth daily.   propranolol (INDERAL) 40 MG tablet Take 0.5 tablets (20 mg total) by mouth 2 (two) times daily. Take 1 tablet daily for temor   No facility-administered encounter medications on file as of 11/16/2018.     PAST MEDICAL HISTORY:   Past Medical History:  Diagnosis Date   Alcoholism /alcohol abuse [facility])    Attending AA meetings.   Anxiety    Depression    Hyperlipidemia    Hypertension     PAST SURGICAL HISTORY:   Past Surgical History:  Procedure Laterality Date   WISDOM TOOTH EXTRACTION      SOCIAL HISTORY:   Social History   Socioeconomic History   Marital status: Divorced    Spouse name: Not on file   Number of children: Not on file   Years of education: Not on file   Highest education level: Not on file  Occupational History   Not on file  Social Needs   Financial resource strain: Not on file   Food insecurity:    Worry: Not on file    Inability: Not on file   Transportation needs:    Medical: Not on file    Non-medical: Not on file  Tobacco Use   Smoking status: Never Smoker   Smokeless tobacco: Current User    Types: Snuff  Substance and Sexual Activity   Alcohol use: Not Currently    Comment: recovering alcoholic just finish up last session of AA   Drug use: Never   Sexual activity: Not on file  Lifestyle   Physical activity:    Days per week: Not on file    Minutes per session: Not on file   Stress: Not on file  Relationships   Social connections:    Talks on phone: Not on file    Gets together: Not on file    Attends religious service: Not on file    Active member of club or organization: Not on file    Attends meetings of clubs or organizations: Not on file    Relationship status: Not on file   Intimate partner violence:    Fear of current or ex partner: Not on file    Emotionally abused: Not on file    Physically  abused: Not on file    Forced sexual activity: Not on file  Other Topics Concern   Not on file  Social History Narrative   Not on file    FAMILY HISTORY:   Family Status  Relation Name Status   Mother  Deceased   Father  Deceased   Sister  Deceased   Emelda Brothers  (Not Specified)   Brother  Deceased   Sister  Deceased   Daughter Kriste Basque Alive       healthy   Son Environmental health practitioner Alive       healthy   Son [facility] Alive       Healthy   Son [place]  Alive       healthy    ROS:  Review of Systems  Constitutional: Negative.   HENT: Negative.   Eyes: Negative.   Respiratory: Negative.   Cardiovascular: Negative.   Gastrointestinal: Negative.   Genitourinary: Negative.   Musculoskeletal: Negative.   Skin: Negative.     PHYSICAL EXAMINATION:    VITALS:   Vitals:   11/16/18 0840  Weight: 188 lb (85.3 kg)  Height: 5\' 11"  (1.803 m)    GEN:  The patient appears stated age and is in NAD.  Neurological examination:  Orientation: The patient is alert and oriented x3. Fund of knowledge is appropriate.  Recent and remote memory are intact.  Attention and concentration are normal.    Able to name objects and repeat phrases. Cranial nerves: There is good facial symmetry. Extraocular muscles are intact. The visual fields are full to confrontational testing. The speech is fluent and clear. Soft palate rises symmetrically and there is no tongue deviation. Hearing is intact to conversational tone. Sensation: unable to test sensitive aspect via video Motor: Strength is at least antigravity x 4.   There is no pronator drift. Deep tendon reflexes: unable via video  Movement examination: Tone: unable Abnormal movements: There is right upper extremity rest tremor Coordination:  There is decremation with RAM's, with any form of RAMS, including alternating supination and pronation of the forearm, hand opening and closing, finger taps, heel taps and toe taps on  the right Gait and Station: The patient pushes off of his couch to arise.  He walks quite well, but is stooped at the waist.  He has decreased arm swing on the right.  ASSESSMENT/PLAN:  1.  Parkinsonism.  I suspect that this does represent idiopathic Parkinson's disease.  The patient has tremor, bradykinesia, rigidity and mild postural instability.  -We discussed the diagnosis as well as pathophysiology of the disease.  We discussed treatment options as well as prognostic indicators.  Patient education was provided.  -We discussed that it used to be thought that levodopa would increase risk of melanoma but now it is believed that Parkinsons itself likely increases risk of melanoma. he is to get regular skin checks.  -Greater than 50% of the 45 minute visit was spent in counseling answering questions and talking about what to expect now as well as in the future.  We talked about medication options as well as potential future surgical options.  We talked about safety in the home.  -For now, he will continue carbidopa/levodopa 25/100, 1 tablet 3 times per day.  He may be a little bit underdosed, but I did not really want to change this on the phone.  -May need to consider an agonist in the future, but I was a little bit leery about doing this given possibilities for compulsive behaviors, especially given that he has only had sobriety for about 2 months.  -I will refer the patient to the Parkinson's program at the [facility]habilitation [facility], for PT/OT and ST.  We talked about the importance of safe, cardiovascular exercise in Parkinson's disease.  -We discussed community resources in the area including patient support groups and community exercise programs for PD and pt education was provided to the patient.  I will have my social worker reach out to him.  2.  Alcoholism and history of Ativan dependency  -Patient went to detox from August 28, 2018 to September 04, 2018.  Patient has been in a counseling  program.   Follow Up Instructions:  4 months.     -I discussed the assessment and treatment plan with the patient. The patient was provided an opportunity to ask questions and all were answered. The patient agreed with the plan and demonstrated an understanding of the instructions.   The patient was advised to call back or seek an in-person evaluation if the symptoms worsen or if the condition fails to improve as anticipated.    Total Time spent in visit with the patient was:  45 min, of which more than 50% of the time was spent in counseling and/or coordinating care on importance of multidisciplinary care, as above.   Pt understands and agrees with the plan of care outlined.  Time referenced did not include the 30 min of record review which was detailed above, which was non face to face time.    Kerin Salen, DO

## 2018-11-16 ENCOUNTER — Other Ambulatory Visit: Payer: Self-pay

## 2018-11-16 ENCOUNTER — Telehealth (INDEPENDENT_AMBULATORY_CARE_PROVIDER_SITE_OTHER): Payer: 59 | Admitting: Neurology

## 2018-11-16 ENCOUNTER — Encounter: Payer: Self-pay | Admitting: Neurology

## 2018-11-16 DIAGNOSIS — G20 Parkinson's disease: Secondary | ICD-10-CM

## 2018-11-16 DIAGNOSIS — F1021 Alcohol dependence, in remission: Secondary | ICD-10-CM | POA: Insufficient documentation

## 2018-11-17 ENCOUNTER — Telehealth: Payer: Self-pay | Admitting: Neurology

## 2018-11-17 NOTE — Telephone Encounter (Signed)
LCSW mailed pt NP packet and info letter from LCSW, encouraged him to call with Qs  -sarah chambers 11/17/18

## 2018-11-19 ENCOUNTER — Other Ambulatory Visit: Payer: Self-pay

## 2018-11-19 ENCOUNTER — Ambulatory Visit (INDEPENDENT_AMBULATORY_CARE_PROVIDER_SITE_OTHER): Payer: 59 | Admitting: Licensed Clinical Social Worker

## 2018-11-19 DIAGNOSIS — F1020 Alcohol dependence, uncomplicated: Secondary | ICD-10-CM | POA: Diagnosis not present

## 2018-11-19 DIAGNOSIS — G20 Parkinson's disease: Secondary | ICD-10-CM

## 2018-11-20 ENCOUNTER — Encounter (HOSPITAL_COMMUNITY): Payer: Self-pay | Admitting: Licensed Clinical Social Worker

## 2018-11-20 NOTE — Progress Notes (Signed)
Virtual Visit via Telephone Note  I connected with Kenneth Solomon on 11/19/18 at 11:30 AM EDT by telephone and verified that I am speaking with the correct person using two identifiers.  Location: Patient: Home Provider: Office   I discussed the limitations, risks, security and privacy concerns of performing an evaluation and management service by telephone and the availability of in person appointments. I also discussed with the patient that there may be a patient responsible charge related to this service. The patient expressed understanding and agreed to proceed.       I discussed the assessment and treatment plan with the patient. The patient was provided an opportunity to ask questions and all were answered. The patient agreed with the plan and demonstrated an understanding of the instructions.   The patient was advised to call back or seek an in-person evaluation if the symptoms worsen or if the condition fails to improve as anticipated.  I provided 55 minutes of non-face-to-face time during this encounter.   [name], LCAS-A    THERAPIST PROGRESS NOTE  Session Time: 11:30-12;30  Participation Level: Active  Behavioral Response: NAAlertEuthymic  Type of Therapy: Individual Therapy  Treatment Goals addressed: Coping  Interventions: Supportive  Summary: Kenneth Solomon is a 63 y.o. male who presents with hx of Alcohol Use Disorder, Severe and GAD. He is active and engaged in his first individual Aftercare session since successful D/C from CD-IOP last week.  He denies any cravings or returns to use of ETOH. He is planning to go to [place] this weekend to pack his house up w/ his family members. He reports on feeling frustrated at his brothers who argue constantly. PT feels better after "venting" his feelings about his family members. I informed PT about my move to [place] office and he was agreeable to see me at this location.   Suicidal/Homicidal: Nowithout  intent/plan  Therapist Response: Counselor used open questions, active listening, and reflection. I helped PT identify his triggers and struggles since leaving CD-IOP and encouraged him on managing his anxieties w/ coping skills. I inquired about PT's securing of a sponsor which he has not done, though he admits he has been asking.   Plan: Return again in 2 weeks.  Diagnosis:    ICD-10-CM   1. Alcohol use disorder, severe, dependence (HCC) F10.20   2. Parkinson disease [facility]) G20     [name], LCAS-A 11/20/2018

## 2018-12-02 ENCOUNTER — Encounter (HOSPITAL_COMMUNITY): Payer: Self-pay | Admitting: Licensed Clinical Social Worker

## 2018-12-02 ENCOUNTER — Ambulatory Visit (INDEPENDENT_AMBULATORY_CARE_PROVIDER_SITE_OTHER): Payer: 59 | Admitting: Licensed Clinical Social Worker

## 2018-12-02 DIAGNOSIS — F1020 Alcohol dependence, uncomplicated: Secondary | ICD-10-CM | POA: Diagnosis not present

## 2018-12-02 DIAGNOSIS — G20 Parkinson's disease: Secondary | ICD-10-CM | POA: Diagnosis not present

## 2018-12-02 NOTE — Progress Notes (Signed)
Virtual Visit via Video Note  I connected with Kenneth Solomon on 12/02/18 at 10:00 AM EDT by a video enabled telemedicine application and verified that I am speaking with the correct person using two identifiers.  Location: Patient: Home Provider: Office   I discussed the limitations of evaluation and management by telemedicine and the availability of in person appointments. The patient expressed understanding and agreed to proceed.      I discussed the assessment and treatment plan with the patient. The patient was provided an opportunity to ask questions and all were answered. The patient agreed with the plan and demonstrated an understanding of the instructions.   The patient was advised to call back or seek an in-person evaluation if the symptoms worsen or if the condition fails to improve as anticipated.  I provided 52 minutes of non-face-to-face time during this encounter.   [name], LCAS-A    THERAPIST PROGRESS NOTE  Session Time: 10-11  Participation Level: Active  Behavioral Response: Well GroomedAlertAngry and Euthymic  Type of Therapy: Individual Therapy  Treatment Goals addressed: Communication: Angry  Interventions: CBT, Assertiveness Training and Anger Management Training  Summary: Kenneth Solomon is a 63 y.o. male who presents with hx of Alcohol Use Disorder currently w/ over 90 days of sobriety. He continues to benefit from individual Aftercare counseling following his successful d/c of CD-IOP 1 month ago. PT reports he is sober, attends meetings, and has a sponsor now. He wants to discuss his feeling "unselttled" the last few days. PT reports he has been frustrated w/ his brother and best friend who are arguing about politics and letting the argument impact their friendship. PT becomes misty eyed when discussing it. I spent time helping PT gain insight into his emotions and avoidance of conflict.    Suicidal/Homicidal: Nowithout intent/plan  Therapist  Response: I used open questions, active listening, and CBT based socractic questioning. I validated PT emotional experience and encouraged assertive communication of his feelings.   Plan: Return again in 2 weeks.  Diagnosis:    ICD-10-CM   1. Alcohol use disorder, severe, dependence (HCC) F10.20   2. Parkinson disease [facility]) G20      [name], LCAS-A 12/02/2018

## 2018-12-16 ENCOUNTER — Ambulatory Visit (INDEPENDENT_AMBULATORY_CARE_PROVIDER_SITE_OTHER): Payer: 59 | Admitting: Licensed Clinical Social Worker

## 2018-12-16 ENCOUNTER — Encounter (HOSPITAL_COMMUNITY): Payer: Self-pay | Admitting: Licensed Clinical Social Worker

## 2018-12-16 DIAGNOSIS — F1020 Alcohol dependence, uncomplicated: Secondary | ICD-10-CM

## 2018-12-16 NOTE — Progress Notes (Signed)
Virtual Visit via Video Note  I connected with Kenneth Solomon on 12/16/18 at  9:00 AM EDT by a video enabled telemedicine application and verified that I am speaking with the correct person using two identifiers.  Location: Patient: Home Provider: Office   I discussed the limitations of evaluation and management by telemedicine and the availability of in person appointments. The patient expressed understanding and agreed to proceed.    I discussed the assessment and treatment plan with the patient. The patient was provided an opportunity to ask questions and all were answered. The patient agreed with the plan and demonstrated an understanding of the instructions.   The patient was advised to call back or seek an in-person evaluation if the symptoms worsen or if the condition fails to improve as anticipated.  I provided 50 minutes of non-face-to-face time during this encounter.   Archie Balboa, LCAS-A    THERAPIST PROGRESS NOTE  Session Time: 9-10  Participation Level: Active  Behavioral Response: Well GroomedAlertEuthymic  Type of Therapy: Individual Therapy  Treatment Goals addressed: Anxiety  Interventions: CBT and Supportive  Summary: Kenneth Solomon is a 63 y.o. male who presents with hx of anxiety and Alcohol Use Disorder Severe. He continues to remain sober, attends AA, and meets w/ his sponsor. He states he is working on step 4.  He discusses a dream he recently had involving a former boss and an award ceremony. We explore themes in PT's life of feeling frustrated and disappointment in not being able to convince someone of something. PT connects this to his ex wives. PT states today was "enlightening" and he wants to work to ask for more help from his daughter.   Suicidal/Homicidal: Nowithout intent/plan  Therapist Response: I used open questions, active listening, linking statements, and dreams exploration. I helped PT process his unresolved feelings towards authority and  themes of "being an investigator".   Plan: Return again in 2 weeks.  Diagnosis: No diagnosis found.    Archie Balboa, LCAS-A 12/16/2018

## 2018-12-28 ENCOUNTER — Ambulatory Visit (INDEPENDENT_AMBULATORY_CARE_PROVIDER_SITE_OTHER): Payer: 59 | Admitting: Licensed Clinical Social Worker

## 2018-12-28 DIAGNOSIS — F1020 Alcohol dependence, uncomplicated: Secondary | ICD-10-CM

## 2018-12-29 ENCOUNTER — Encounter (HOSPITAL_COMMUNITY): Payer: Self-pay | Admitting: Licensed Clinical Social Worker

## 2018-12-29 NOTE — Progress Notes (Signed)
Virtual Visit via Video Note  I connected with [name] on 12/29/18 at  9:00 AM EDT by a video enabled telemedicine application and verified that I am speaking with the correct person using two identifiers.  Location: Patient: Home Provider: Office   I discussed the limitations of evaluation and management by telemedicine and the availability of in person appointments. The patient expressed understanding and agreed to proceed.     I discussed the assessment and treatment plan with the patient. The patient was provided an opportunity to ask questions and all were answered. The patient agreed with the plan and demonstrated an understanding of the instructions.   The patient was advised to call back or seek an in-person evaluation if the symptoms worsen or if the condition fails to improve as anticipated.  I provided 52 minutes of non-face-to-face time during this encounter.   Archie Balboa, LCAS-A    THERAPIST PROGRESS NOTE  Session Time: 9-10am  Participation Level: Active  Behavioral Response: Well GroomedAlertEuthymic  Type of Therapy: Individual Therapy  Treatment Goals addressed: Coping  Interventions: CBT and Supportive  Summary: Deshon Hsiao is a 63 y.o. male who presents with hx of Alcohol Use Disorder, Severe and recently completed CD-IOP. He presents for his final individual Aftercare session. He continues to remain sober, upbeat, working w/ a sponsor, and attending AA virtually and in-person. He discusses his past hx w/ ex-wives and meaning-making of his retirement life. He continues to meet his goals of packing his house to sell and contacting his real estate agent.    Suicidal/Homicidal: Nowithout intent/plan  Therapist Response: I used open questions, active listening, supportive encouragement. We discussed plans for continuing success in sobriety and his goals for maintaining his recovery.   Plan: Return again as needed.  Diagnosis:    ICD-10-CM   1.  Alcohol use disorder, severe, dependence ([place])  F10.20       Archie Balboa, LCAS-A 12/29/2018

## 2019-01-11 ENCOUNTER — Telehealth: Payer: Self-pay | Admitting: Clinical

## 2019-01-11 NOTE — Telephone Encounter (Signed)
LCSW called pt to discuss interest in RSB & scheduled zoom meeting for 01/14/19 with RSB coach

## 2019-02-19 ENCOUNTER — Other Ambulatory Visit: Payer: Self-pay | Admitting: Family Medicine

## 2019-02-19 ENCOUNTER — Other Ambulatory Visit (HOSPITAL_COMMUNITY): Payer: Self-pay | Admitting: Medical

## 2019-04-06 NOTE — Progress Notes (Signed)
Kenneth Solomon was seen today in follow up for Parkinsons disease.  Pt is currently on carbidopa/levodopa 25/100, 1 tablet 3 times per day.  Does not think that tremor ever stops, with the exception of in his sleep.  Pt denies falls.  Pt denies lightheadedness, near syncope.  No hallucinations.  Mood has been good.  Is doing RSB two days a week.  No drinking for 7 months.  He is going to [place] for 3 days a week.      PREVIOUS MEDICATIONS: Carbidopa/levodopa; propranolol, 20 mg twice daily  ALLERGIES:  No Known Allergies  CURRENT MEDICATIONS:  Outpatient Encounter Medications as of 04/09/2019  Medication Sig  . atorvastatin (LIPITOR) 40 MG tablet take 1 tablet by mouth once daily  . Multiple Vitamin (MULTIVITAMIN) tablet Take by mouth.  . olmesartan (BENICAR) 20 MG tablet Take 1 tablet (20 mg total) by mouth daily.  . propranolol (INDERAL) 40 MG tablet Take 0.5 tablets (20 mg total) by mouth 2 (two) times daily. Take 1 tablet daily for temor  . [DISCONTINUED] Carbidopa-Levodopa ER (SINEMET CR) 25-100 MG tablet controlled release TAKE 1 TABLET BY MOUTH AT 8:00, NOON AND 4 PM  . carbidopa-levodopa (SINEMET IR) 25-100 MG tablet 2 tablets at 7am, 2 tablets at 11 am, and 1 at 4pm  . escitalopram (LEXAPRO) 10 MG tablet Take 1 tablet (10 mg total) by mouth daily.   No facility-administered encounter medications on file as of 04/09/2019.     PAST MEDICAL HISTORY:   Past Medical History:  Diagnosis Date  . Alcoholism /alcohol abuse [facility])    Attending [place] meetings.  . Anxiety   . Depression   . Hyperlipidemia   . Hypertension     PAST SURGICAL HISTORY:   Past Surgical History:  Procedure Laterality Date  . WISDOM TOOTH EXTRACTION      SOCIAL HISTORY:   Social History   Socioeconomic History  . Marital status: Divorced    Spouse name: Not on file  . Number of children: 4  . Years of education: Not on file  . Highest education level: Bachelor's degree (e.g., BA, AB, BS)   Occupational History  . Not on file  Social Needs  . Financial resource strain: Not on file  . Food insecurity    Worry: Not on file    Inability: Not on file  . Transportation needs    Medical: Not on file    Non-medical: Not on file  Tobacco Use  . Smoking status: Never Smoker  . Smokeless tobacco: Current User    Types: Snuff  Substance and Sexual Activity  . Alcohol use: Not Currently    Comment: recovering alcoholic just finish up last session of AA  . Drug use: Never  . Sexual activity: Not on file  Lifestyle  . Physical activity    Days per week: Not on file    Minutes per session: Not on file  . Stress: Not on file  Relationships  . Social Herbalist on phone: Not on file    Gets together: Not on file    Attends religious service: Not on file    Active member of club or organization: Not on file    Attends meetings of clubs or organizations: Not on file    Relationship status: Not on file  . Intimate partner violence    Fear of current or ex partner: Not on file    Emotionally abused: Not on file  Physically abused: Not on file    Forced sexual activity: Not on file  Other Topics Concern  . Not on file  Social History Narrative  . Not on file    FAMILY HISTORY:   Family Status  Relation Name Status  . Mother  Deceased  . Father  Deceased  . Sister  Deceased  . Emelda BrothersPat Aunt  (Not Specified)  . Brother  Deceased  . Sister  Deceased  . Daughter Kriste Basquemanda Feder Alive       healthy  . Son Manus Rudddam Hanline Alive       healthy  . Son Greggory [facility] Alive       Healthy  . Son Microsoftustin Holderr Alive       healthy    ROS:  Review of Systems  Constitutional: Negative.   HENT: Negative.   Eyes: Negative.   Respiratory: Negative.   Cardiovascular: Negative.   Gastrointestinal: Negative.   Genitourinary: Negative.   Musculoskeletal: Negative.   Skin: Negative.     PHYSICAL EXAMINATION:    VITALS:   Vitals:   04/09/19 1034  BP: 127/81  Pulse:  67  SpO2: 99%  Weight: 188 lb 6.4 oz (85.5 kg)  Height: 5\' 11"  (1.803 m)    GEN:  The patient appears stated age and is in NAD. HEENT:  Normocephalic, atraumatic.  The mucous membranes are moist. The superficial temporal arteries are without ropiness or tenderness. CV:  RRR Lungs:  CTAB Neck/HEME:  There are no carotid bruits bilaterally.  Neurological examination:  Orientation: The patient is alert and oriented x3. Cranial nerves: There is good facial symmetry with mild facial hypomimia. The speech is fluent and clear. Soft palate rises symmetrically and there is no tongue deviation. Hearing is intact to conversational tone. Sensation: Sensation is intact to light touch throughout Motor: Strength is at least antigravity x4.  Movement examination: Tone: There is moderate to severe increased tone in the right upper extremity.  The right upper extremity is dystonic.  There is moderate to severe increased tone in the right lower extremity as well.  There is mild to moderate increased tone in the left upper and lower extremity. Abnormal movements: There is near constant right upper extremity resting tremor. Coordination:  There is significant decremation with RAM's, with any form of RAMS, including alternating supination and pronation of the forearm, hand opening and closing, finger taps, heel taps and toe taps on the right Gait and Station: The patient has no difficulty arising out of a deep-seated chair without the use of the hands. The patient's stride length is good, but the right upper extremity is held in a dystonic posture.  He has some trouble running down the hall.      ASSESSMENT/PLAN:  1.    Parkinson's disease   -Patient is significantly underdosed, with right upper extremity tremor, severe rigidity and dystonic posturing of the right upper extremity.    -Patient has never been on immediate release levodopa.  I am going to stop his extended release levodopa.    -He will start  carbidopa/levodopa 25/100 IR, 1 tablet 3 times per day for a week, and over the next several weeks, we will ultimately work him up to 2 tablets at 7 AM, 2 tablets at 11 AM and 1 tablet at 4 PM.  He will let me know if he has any side effects.               -May need to consider an agonist  in the future, but I was a little bit leery about doing this given possibilities for compulsive behaviors, especially given that he has only had sobriety for about 7 months.  -Patient asked me about DBS surgery as well as focused ultrasound.  Provided information on this, although he needs to get to a much better dose of medication before we consider surgery.    2.  Alcoholism and history of Ativan dependency             -Patient going to AA 3 days per week.  Congratulated him.  Proud of the work that he is doing  3.  Follow up is anticipated in the next 4 months, sooner should new neurologic issues arise.  Much greater than 50% of this visit was spent in counseling and coordinating care.  Total face to face time:  30 min  Cc:  [place], Betty G, MD/

## 2019-04-09 ENCOUNTER — Ambulatory Visit (INDEPENDENT_AMBULATORY_CARE_PROVIDER_SITE_OTHER): Payer: 59 | Admitting: Neurology

## 2019-04-09 ENCOUNTER — Encounter: Payer: Self-pay | Admitting: Neurology

## 2019-04-09 ENCOUNTER — Other Ambulatory Visit: Payer: Self-pay

## 2019-04-09 VITALS — BP 127/81 | HR 67 | Ht 71.0 in | Wt 188.4 lb

## 2019-04-09 DIAGNOSIS — G249 Dystonia, unspecified: Secondary | ICD-10-CM | POA: Diagnosis not present

## 2019-04-09 DIAGNOSIS — G20 Parkinson's disease: Secondary | ICD-10-CM

## 2019-04-09 MED ORDER — CARBIDOPA-LEVODOPA 25-100 MG PO TABS: ORAL_TABLET | ORAL | 1 refills | Status: DC

## 2019-04-09 NOTE — Patient Instructions (Signed)
1.  Stop carbidopa/levodopa 25/100 ER 2.  Start carbidopa/levodopa 25/100 (this will be a yellow tablet), 1 tablet at 7am/11 am/4 pm for a week, then take 2 tablets at 7am, 1 at 11am, and 1 at 4pm and then at week 3, increase to 2 tablets at 7am, 2 tablets at 11 am, and 1 at 4pm

## 2019-04-12 ENCOUNTER — Ambulatory Visit: Payer: 59 | Admitting: Neurology

## 2019-05-10 ENCOUNTER — Other Ambulatory Visit: Payer: Self-pay | Admitting: Family Medicine

## 2019-05-10 NOTE — Telephone Encounter (Signed)
Do you want to  Refill since patient is seeing Neuro?

## 2019-05-22 ENCOUNTER — Other Ambulatory Visit: Payer: Self-pay | Admitting: Family Medicine

## 2019-06-10 ENCOUNTER — Other Ambulatory Visit: Payer: Self-pay

## 2019-06-10 MED ORDER — CARBIDOPA-LEVODOPA 25-100 MG PO TABS: ORAL_TABLET | ORAL | 1 refills | Status: DC

## 2019-06-22 ENCOUNTER — Other Ambulatory Visit: Payer: Self-pay | Admitting: Family Medicine

## 2019-06-23 ENCOUNTER — Other Ambulatory Visit: Payer: Self-pay

## 2019-06-23 MED ORDER — PROPRANOLOL HCL 40 MG PO TABS: ORAL_TABLET | ORAL | 0 refills | Status: DC

## 2019-07-05 ENCOUNTER — Telehealth: Payer: Self-pay | Admitting: Clinical

## 2019-07-05 NOTE — Telephone Encounter (Signed)
I received the following info via email from pt, can you advise what he should do?  Maralyn Sago hope you are enjoying the holidays. I have a question. Could you call me when you get a chance but no rush.  I have questions about my right hand and the possibility  that I have dupuytren's contracture along with Parkinson's. I may need an appointment.  Thanks

## 2019-07-05 NOTE — Telephone Encounter (Signed)
My response is already below.  Please make pt aware

## 2019-07-05 NOTE — Telephone Encounter (Signed)
Dupuytrens contracture isnt associated with Parkinsons and is an orthopedic problem.  I would start with the PCP and see what he/she thinks first

## 2019-07-05 NOTE — Telephone Encounter (Signed)
This is a Dr. Arbutus Leas patient, I forwarded it to her

## 2019-07-05 NOTE — Telephone Encounter (Signed)
Left message to call office

## 2019-07-05 NOTE — Telephone Encounter (Signed)
Please see

## 2019-07-05 NOTE — Telephone Encounter (Signed)
No answer

## 2019-07-06 NOTE — Telephone Encounter (Signed)
I emailed pt and let him know that the office is trying to reach him to let him know to contact his PCP re concern for contracture

## 2019-07-23 ENCOUNTER — Other Ambulatory Visit: Payer: Self-pay

## 2019-07-23 MED ORDER — PROPRANOLOL HCL 40 MG PO TABS: ORAL_TABLET | ORAL | 0 refills | Status: DC

## 2019-08-10 ENCOUNTER — Other Ambulatory Visit: Payer: Self-pay

## 2019-08-10 MED ORDER — CARBIDOPA-LEVODOPA 25-100 MG PO TABS: ORAL_TABLET | ORAL | 1 refills | Status: DC

## 2019-08-19 ENCOUNTER — Other Ambulatory Visit: Payer: Self-pay | Admitting: Family Medicine

## 2019-08-23 NOTE — Progress Notes (Signed)
Virtual Visit via Video Note The purpose of this virtual visit is to provide medical care while limiting exposure to the novel coronavirus.    Consent was obtained for video visit:  Yes.   Answered questions that patient had about telehealth interaction:  Yes.   I discussed the limitations, risks, security and privacy concerns of performing an evaluation and management service by telemedicine. I also discussed with the patient that there may be a patient responsible charge related to this service. The patient expressed understanding and agreed to proceed.  Pt location: Home Physician Location: home Name of referring provider:  [place], Betty G, MD I connected with Kenneth Solomon at patients initiation/request on 08/25/2019 at  8:15 AM EST by video enabled telemedicine application and verified that I am speaking with the correct person using two identifiers. Pt MRN:  [id number] Pt DOB:  Oct 20, 1955 Video Participants:  Kenneth Solomon;     History of Present Illness:  Patient seen today in follow-up for Parkinson's disease.  Patients medication was increased last visit.   He is doing RSB and feels that is "doing wonders."  He is, however, feeling that the R arm is still really stiff.  He is able to run but the R arm is the issue. If he misses the med by about 2 hours, the arm is more rigid.  He is sober and going to 1-2 meetings weekly.  Pt denies falls.  Pt denies lightheadedness, near syncope.  No hallucinations.  Mood has been good.  Current movement d/o meds: Carbidopa/levodopa 25/100, 2 tablets at 7 AM/2 tablets at 11 AM/1 tablet at 4 PM   Prior medications: Levodopa; propranolol, 20 mg twice a day   Current Outpatient Medications on File Prior to Visit  Medication Sig Dispense Refill  . atorvastatin (LIPITOR) 40 MG tablet take 1 tablet by mouth once daily    . carbidopa-levodopa (SINEMET IR) 25-100 MG tablet 2 tablets at 7am, 2 tablets at 11 am, and 1 at 4pm 150 tablet 1  .  escitalopram (LEXAPRO) 10 MG tablet TAKE 1 TABLET(10 MG) BY MOUTH DAILY 90 tablet 1  . Multiple Vitamin (MULTIVITAMIN) tablet Take by mouth.    . olmesartan (BENICAR) 20 MG tablet TAKE 1 TABLET(20 MG) BY MOUTH DAILY 90 tablet 1  . propranolol (INDERAL) 40 MG tablet TAKE 1/2 TABLET BY MOUTH TWICE DAILY FOR TREMOR 30 tablet 2   No current facility-administered medications on file prior to visit.     Observations/Objective:   There were no vitals filed for this visit. GEN:  The patient appears stated age and is in NAD.  Neurological examination:  Orientation: The patient is alert and oriented x3. Cranial nerves: There is good facial symmetry. There is facial hypomimia.  The speech is fluent and clear. Soft palate rises symmetrically and there is no tongue deviation. Hearing is intact to conversational tone. Motor: Strength is at least antigravity x 4.   Shoulder shrug is equal and symmetric.  There is no pronator drift.  Movement examination: Tone: unable Abnormal movements: there is RUE tremor.  Right hand appears significantly dystonic. Coordination:  There is  decremation with RAM's, with any form of RAMS, including alternating supination and pronation of the forearm, hand opening and closing, finger taps on the right.     Assessment and Plan:   1.  Parkinsons Disease  -Continue carbidopa/levodopa 25/100, 2/2/1  --Somewhat leery about agonist therapy in him because of possibility for compulsive behavior, given that patient has had sobriety  for only about 1 year.  He and I discussed this for a long time and he feels really strong about his sobriety and he is going to meetings 1-2 days per week.  We talked about risks and benefits, and ultimately he decided to try rotigotine patch.  We will start 2 mg for 1 week and then increase to 4 mg daily.  Talked purpose, benefits, and side effects, including but not limited to compulsive behaviors and sleep attacks.  He will pick up samples from my  office.  2.  History of alcoholism and history of Ativan dependency  -Patient is involved with AA.  Follow Up Instructions:  4-5 months  -I discussed the assessment and treatment plan with the patient. The patient was provided an opportunity to ask questions and all were answered. The patient agreed with the plan and demonstrated an understanding of the instructions.   The patient was advised to call back or seek an in-person evaluation if the symptoms worsen or if the condition fails to improve as anticipated.    Total time spent on today's visit was 30 minutes, including both face-to-face time and nonface-to-face time.  Time included that spent on review of records (prior notes available to me/labs/imaging if pertinent), discussing treatment and goals, answering patient's questions and coordinating care.   Kerin Salen, DO

## 2019-08-25 ENCOUNTER — Other Ambulatory Visit: Payer: Self-pay

## 2019-08-25 ENCOUNTER — Encounter: Payer: Self-pay | Admitting: Neurology

## 2019-08-25 ENCOUNTER — Telehealth (INDEPENDENT_AMBULATORY_CARE_PROVIDER_SITE_OTHER): Payer: 59 | Admitting: Neurology

## 2019-08-25 DIAGNOSIS — F1021 Alcohol dependence, in remission: Secondary | ICD-10-CM

## 2019-08-25 DIAGNOSIS — G249 Dystonia, unspecified: Secondary | ICD-10-CM | POA: Diagnosis not present

## 2019-08-25 DIAGNOSIS — G20 Parkinson's disease: Secondary | ICD-10-CM

## 2019-08-25 MED ORDER — ROTIGOTINE 4 MG/24HR TD PT24: 1.0000 | MEDICATED_PATCH | Freq: Every day | TRANSDERMAL | 5 refills | Status: DC

## 2019-09-21 ENCOUNTER — Encounter: Payer: Self-pay | Admitting: Family Medicine

## 2019-09-21 ENCOUNTER — Other Ambulatory Visit: Payer: Self-pay

## 2019-09-21 ENCOUNTER — Ambulatory Visit (INDEPENDENT_AMBULATORY_CARE_PROVIDER_SITE_OTHER): Payer: 59 | Admitting: Family Medicine

## 2019-09-21 VITALS — BP 118/78 | HR 56 | Temp 96.0°F | Resp 16 | Ht 71.0 in | Wt 190.4 lb

## 2019-09-21 DIAGNOSIS — F1994 Other psychoactive substance use, unspecified with psychoactive substance-induced mood disorder: Secondary | ICD-10-CM

## 2019-09-21 DIAGNOSIS — Z1159 Encounter for screening for other viral diseases: Secondary | ICD-10-CM

## 2019-09-21 DIAGNOSIS — Z125 Encounter for screening for malignant neoplasm of prostate: Secondary | ICD-10-CM | POA: Diagnosis not present

## 2019-09-21 DIAGNOSIS — M65331 Trigger finger, right middle finger: Secondary | ICD-10-CM

## 2019-09-21 DIAGNOSIS — Z130 Encounter for screening for diseases of the blood and blood-forming organs and certain disorders involving the immune mechanism: Secondary | ICD-10-CM | POA: Diagnosis not present

## 2019-09-21 DIAGNOSIS — Z0000 Encounter for general adult medical examination without abnormal findings: Secondary | ICD-10-CM

## 2019-09-21 DIAGNOSIS — I10 Essential (primary) hypertension: Secondary | ICD-10-CM

## 2019-09-21 DIAGNOSIS — E785 Hyperlipidemia, unspecified: Secondary | ICD-10-CM

## 2019-09-21 DIAGNOSIS — Z1329 Encounter for screening for other suspected endocrine disorder: Secondary | ICD-10-CM | POA: Diagnosis not present

## 2019-09-21 DIAGNOSIS — Z9189 Other specified personal risk factors, not elsewhere classified: Secondary | ICD-10-CM

## 2019-09-21 DIAGNOSIS — Z13228 Encounter for screening for other metabolic disorders: Secondary | ICD-10-CM | POA: Diagnosis not present

## 2019-09-21 DIAGNOSIS — G20 Parkinson's disease: Secondary | ICD-10-CM

## 2019-09-21 LAB — LIPID PANEL
Cholesterol: 214 mg/dL — ABNORMAL HIGH (ref 0–200)
HDL: 45.3 mg/dL (ref >39.00)
LDL Cholesterol: 144 mg/dL — ABNORMAL HIGH (ref 0–99)
NonHDL: 168.79
Total CHOL/HDL Ratio: 5
Triglycerides: 125 mg/dL (ref 0.0–149.0)
VLDL: 25 mg/dL (ref 0.0–40.0)

## 2019-09-21 LAB — COMPREHENSIVE METABOLIC PANEL
ALT: 10 U/L (ref 0–53)
AST: 17 U/L (ref 0–37)
Albumin: 4.4 g/dL (ref 3.5–5.2)
Alkaline Phosphatase: 61 U/L (ref 39–117)
BUN: 18 mg/dL (ref 6–23)
CO2: 31 mEq/L (ref 19–32)
Calcium: 9.7 mg/dL (ref 8.4–10.5)
Chloride: 101 mEq/L (ref 96–112)
Creatinine, Ser: 0.96 mg/dL (ref 0.40–1.50)
GFR: 79.04 mL/min (ref >60.00)
Glucose, Bld: 106 mg/dL — ABNORMAL HIGH (ref 70–99)
Potassium: 4.5 mEq/L (ref 3.5–5.1)
Sodium: 136 mEq/L (ref 135–145)
Total Bilirubin: 0.8 mg/dL (ref 0.2–1.2)
Total Protein: 7 g/dL (ref 6.0–8.3)

## 2019-09-21 LAB — HEMOGLOBIN A1C: Hgb A1c MFr Bld: 5.6 % (ref 4.6–6.5)

## 2019-09-21 LAB — PSA: PSA: 0.4 ng/mL (ref 0.10–4.00)

## 2019-09-21 NOTE — Patient Instructions (Addendum)
A few things to remember from today's visit:   Orders Placed This Encounter  Procedures  . Comprehensive metabolic panel  . PSA(Must document that pt has been informed of limitations of PSA testing.)  . Lipid panel  . Hemoglobin A1c  . Hepatitis C antibody screen    At least 150 minutes of moderate exercise per week, daily brisk walking for 15-30 min is a good exercise option. Healthy diet low in saturated (animal) fats and sweets and consisting of fresh fruits and vegetables, lean meats such as fish and white chicken and whole grains.  - Vaccines: Pneumonia and shingles vaccine at least 2-3 weeks after COVID 19 vaccine.  Tdap vaccine every 10 years.  Shingles vaccine recommended at age 21, could be given after 64 years of age but not sure about insurance coverage.  Pneumonia vaccines:  Pneumovax at 65.   -Screening recommendations for low/normal risk males:  Screening for diabetes at age 56 and every 3 years. Earlier screening if cardiovascular risk factors.   Lipid screening at 35 and every 3 years. Screening starts in younger males with cardiovascular risk factors.N/A  Colon cancer screening at age 21 and until age 54.  Prostate cancer screening: some controversy, starts usually at 50: Rectal exam and PSA.  Aortic Abdominal Aneurism once between 95 and 58 years old if ever smoker.  Also recommended:  1. Dental visit- Brush and floss your teeth twice daily; visit your dentist twice a year. 2. Eye doctor- Get an eye exam at least every 2 years. 3. Helmet use- Always wear a helmet when riding a bicycle, motorcycle, rollerblading or skateboarding. 4. Safe sex- If you may be exposed to sexually transmitted infections, use a condom. 5. Seat belts- Seat belts can save your live; always wear one. 6. Smoke/Carbon Monoxide detectors- These detectors need to be installed on the appropriate level of your home. Replace batteries at least once a year. 7. Skin cancer- When out in the  sun please cover up and use sunscreen 15 SPF or higher. 8. Violence- If anyone is threatening or hurting you, please tell your healthcare provider.  9. Drink alcohol in moderation- Limit alcohol intake to one drink or less per day. Never drink and drive.  Please be sure medication list is accurate. If a new problem present, please set up appointment sooner than planned today.

## 2019-09-21 NOTE — Progress Notes (Signed)
HPI:  Kenneth Solomon is a 64 y.o.male here today for his routine physical examination.  Last CPE: 2019. He lives with his daughter  Regular exercise 3 or more times per week: Boxing 3-4 times per week. Following a healthy diet: He acknowledges he can do better.  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 Depression and anxiety. He is on Lexapro 10 mg. He attend AA 3 times per week.  He follows with neurologist, Dr Tat.   Immunization History  Administered Date(s) Administered  . Influenza,inj,Quad PF,6+ Mos 06/08/2018  . Influenza-Unspecified 05/29/2015, 05/01/2016, 05/01/2017, 05/02/2019  . PFIZER SARS-COV-2 Vaccination 09/23/2019  . Tdap 01/16/2016    -Hep C screening:  He is not sure.  Last colon cancer screening: 09/2014. Last prostate ca screening: Not sure about last PSA. Nocturia x 1-2, stable for 3-4 years.  -Denies tobacco use or Hx of illicit drug use.  -Concerns and/or follow up today:  He thinks he may have dupuytren's contracture in one of his fingers. No limitation of ROM. Occasionally snapping sensation. No associated pain or edema.   Review of Systems  Constitutional: Negative for activity change, appetite change, fatigue and fever.  HENT: Negative for mouth sores, nosebleeds, sore throat and trouble swallowing.   Eyes: Negative for redness and visual disturbance.  Respiratory: Negative for cough, shortness of breath and wheezing.   Cardiovascular: Negative for chest pain, palpitations and leg swelling.  Gastrointestinal: Negative for abdominal pain, nausea and vomiting.       Changes in bowel habits.  Endocrine: Negative for cold intolerance, heat intolerance, polydipsia, polyphagia and polyuria.  Genitourinary: Negative for decreased urine volume, dysuria, genital sores, hematuria and testicular pain.  Musculoskeletal: Positive for gait problem. Negative for  myalgias.  Skin: Negative for color change and rash.  Allergic/Immunologic: Negative for environmental allergies.  Neurological: Positive for tremors. Negative for syncope and headaches.  Hematological: Negative for adenopathy. Does not bruise/bleed easily.  Psychiatric/Behavioral: Negative for confusion and sleep disturbance. The patient is nervous/anxious.     Current Outpatient Medications on File Prior to Visit  Medication Sig Dispense Refill  . atorvastatin (LIPITOR) 40 MG tablet take 1 tablet by mouth once daily    . carbidopa-levodopa (SINEMET IR) 25-100 MG tablet 2 tablets at 7am, 2 tablets at 11 am, and 1 at 4pm 150 tablet 1  . cholecalciferol (VITAMIN D3) 25 MCG (1000 UNIT) tablet Take 1,000 Units by mouth daily.    Marland Kitchen escitalopram (LEXAPRO) 10 MG tablet TAKE 1 TABLET(10 MG) BY MOUTH DAILY 90 tablet 1  . Multiple Vitamin (MULTIVITAMIN) tablet Take by mouth.    . olmesartan (BENICAR) 20 MG tablet TAKE 1 TABLET(20 MG) BY MOUTH DAILY 90 tablet 1  . propranolol (INDERAL) 40 MG tablet TAKE 1/2 TABLET BY MOUTH TWICE DAILY FOR TREMOR 30 tablet 2  . rotigotine (NEUPRO) 4 MG/24HR Place 1 patch onto the skin daily. 30 patch 5   No current facility-administered medications on file prior to visit.    Past Medical History:  Diagnosis Date  . Alcoholism /alcohol abuse [facility])    Attending AA meetings.  . Anxiety   . Depression   . Hyperlipidemia   . Hypertension     Past Surgical History:  Procedure Laterality Date  . WISDOM TOOTH EXTRACTION      No Known Allergies  Family History  Problem Relation Age of Onset  . Hyperlipidemia Mother   . Hypertension Mother   .  Stroke Mother   . Heart disease Father   . Cancer Sister        Leukemia  . Heart disease Paternal Aunt     Social History   Socioeconomic History  . Marital status: Divorced    Spouse name: Not on file  . Number of children: 4  . Years of education: Not on file  . Highest education level: Bachelor's degree  (e.g., BA, AB, BS)  Occupational History  . Not on file  Tobacco Use  . Smoking status: Never Smoker  . Smokeless tobacco: Current User    Types: Snuff  Substance and Sexual Activity  . Alcohol use: Not Currently    Comment: recovering alcoholic just finish up last session of AA  . Drug use: Never  . Sexual activity: Not on file  Other Topics Concern  . Not on file  Social History Narrative  . Not on file   Social Determinants of Health   Financial Resource Strain:   . Difficulty of Paying Living Expenses:   Food Insecurity:   . Worried About Programme researcher, broadcasting/film/video in the Last Year:   . Barista in the Last Year:   Transportation Needs:   . Freight forwarder (Medical):   Marland Kitchen Lack of Transportation (Non-Medical):   Physical Activity:   . Days of Exercise per Week:   . Minutes of Exercise per Session:   Stress:   . Feeling of Stress :   Social Connections:   . Frequency of Communication with Friends and Family:   . Frequency of Social Gatherings with Friends and Family:   . Attends Religious Services:   . Active Member of Clubs or Organizations:   . Attends Banker Meetings:   Marland Kitchen Marital Status:     Vitals:   09/21/19 0931  BP: 118/78  Pulse: (!) 56  Resp: 16  Temp: (!) 96 F (35.6 C)  SpO2: 97%   Body mass index is 26.56 kg/m.   Wt Readings from Last 3 Encounters:  09/21/19 190 lb 6.4 oz (86.4 kg)  04/09/19 188 lb 6.4 oz (85.5 kg)  11/16/18 188 lb (85.3 kg)    Physical Exam  Nursing note and vitals reviewed. Constitutional: He is oriented to person, place, and time. He appears well-developed. No distress.  HENT:  Head: Atraumatic.  Right Ear: Tympanic membrane, external ear and ear canal normal.  Left Ear: Tympanic membrane, external ear and ear canal normal.  Mouth/Throat: Oropharynx is clear and moist and mucous membranes are normal.  Eyes: Pupils are equal, round, and reactive to light. Conjunctivae and EOM are normal.  Neck:  No tracheal deviation present. No thyromegaly present.  Cardiovascular: Regular rhythm. Bradycardia present.  No murmur heard. Pulses:      Dorsalis pedis pulses are 2+ on the right side and 2+ on the left side.  Respiratory: Effort normal and breath sounds normal. No respiratory distress.  GI: Soft. He exhibits no mass. There is no hepatomegaly. There is no abdominal tenderness.  Genitourinary:    Genitourinary Comments: No concerns.   Musculoskeletal:        General: No tenderness or edema.     Cervical back: Normal range of motion.     Comments: No signs of synovitis. Upon inspection thickened tendon with nodular like pattern,palm right hand. No fixed contractures appreciated. No limited active extension or flexion, minimal clicking sensation upon flexion and extension movement. No tenderness upon palpation.  Lymphadenopathy:  He has no cervical adenopathy.       Right: No supraclavicular adenopathy present.       Left: No supraclavicular adenopathy present.  Neurological: He is alert and oriented to person, place, and time. He has normal strength. He displays tremor. No cranial nerve deficit. Coordination and gait abnormal.  Reflex Scores:      Bicep reflexes are 2+ on the right side and 2+ on the left side.      Patellar reflexes are 2+ on the right side and 2+ on the left side. Gait is not assisted.  Skin: Skin is warm. No erythema.  Psychiatric: He has a normal mood and affect.  Well groomed, good eye contact.   ASSESSMENT AND PLAN:  Kenneth Solomon was seen today for annual exam.  Diagnoses and all orders for this visit:  Orders Placed This Encounter  Procedures  . Comprehensive metabolic panel  . PSA(Must document that pt has been informed of limitations of PSA testing.)  . Lipid panel  . Hemoglobin A1c  . Hepatitis C antibody screen   Lab Results  Component Value Date   HGBA1C 5.6 09/21/2019   Lab Results  Component Value Date   PSA 0.40 09/21/2019   Lab  Results  Component Value Date   ALT 10 09/21/2019   AST 17 09/21/2019   ALKPHOS 61 09/21/2019   BILITOT 0.8 09/21/2019   Lab Results  Component Value Date   CREATININE 0.96 09/21/2019   BUN 18 09/21/2019   NA 136 09/21/2019   K 4.5 09/21/2019   CL 101 09/21/2019   CO2 31 09/21/2019    Lab Results  Component Value Date   CHOL 214 (H) 09/21/2019   HDL 45.30 09/21/2019   LDLCALC 144 (H) 09/21/2019   TRIG 125.0 09/21/2019   CHOLHDL 5 09/21/2019    Routine general medical examination at a health care facility We discussed the importance of regular physical activity and healthy diet for prevention of chronic illness and/or complications. Preventive guidelines reviewed. Vaccination: He needs pneumovax and Shingrix but recent COVID 19 vaccine.   Next CPE in a year. The 10-year ASCVD risk score Denman Rannie DC Montez Hageman., et al., 2013) is: 12.4%   Values used to calculate the score:     Age: 3 years     Sex: Male     Is Non-Hispanic African American: No     Diabetic: No     Tobacco smoker: No     Systolic Blood Pressure: 118 mmHg     Is BP treated: Yes     HDL Cholesterol: 45.3 mg/dL     Total Cholesterol: 214 mg/dL  Trigger finger, right middle finger Educated about Dx,prognosis,and treatment options. It is mild, so I thinks he can continue monitoring for now.  Hyperlipidemia, unspecified hyperlipidemia type Continue Atorvastatin 40 mg daily. Further recommendations according to lab results.  Essential hypertension BP adequately controlled,on lower normal range. No changes in current management. Continue low salt diet.  Prostate cancer screening -     PSA(Must document that pt has been informed of limitations of PSA testing.)  Encounter for HCV screening test for high risk patient -     Hepatitis C antibody screen  Screening for endocrine, metabolic and immunity disorder -     Comprehensive metabolic panel -     Hemoglobin A1c  Parkinson disease (HCC) Following with  neurologist.  Substance induced mood disorder (HCC) Problem well controlled with Lexapro 10 mg daily. Continue attending AA meetings. Following with  neuro,Dr Tat.   Return in 6 months (on 03/23/2020).    Rodell Marrs G. Martin[place], MD  [facility]. [place] office.

## 2019-09-22 LAB — HEPATITIS C ANTIBODY
Hepatitis C Ab: NONREACTIVE
SIGNAL TO CUT-OFF: 0.03 (ref <1.00)

## 2019-09-23 ENCOUNTER — Ambulatory Visit: Payer: 59 | Attending: Internal Medicine

## 2019-09-23 DIAGNOSIS — Z23 Encounter for immunization: Secondary | ICD-10-CM

## 2019-09-23 NOTE — Progress Notes (Signed)
   Covid-19 Vaccination Clinic  Name:  Kenneth Solomon    MRN: [id number] DOB: Jul 06, 1955  09/23/2019  Kenneth Solomon was observed post Covid-19 immunization for 15 minutes without incident. He was provided with Vaccine Information Sheet and instruction to access the V-Safe system.   Kenneth Solomon was instructed to call 911 with any severe reactions post vaccine: Marland Kitchen Difficulty breathing  . Swelling of face and throat  . A fast heartbeat  . A bad rash all over body  . Dizziness and weakness   Immunizations Administered    Name Date Dose VIS Date Route   Pfizer COVID-19 Vaccine 09/23/2019  8:47 AM 0.3 mL 06/11/2019 Intramuscular   Manufacturer: [employer], [employer]   Lot: [id number]   NDC: [address]-6

## 2019-10-11 ENCOUNTER — Other Ambulatory Visit: Payer: Self-pay

## 2019-10-11 MED ORDER — CARBIDOPA-LEVODOPA 25-100 MG PO TABS: ORAL_TABLET | ORAL | 0 refills | Status: DC

## 2019-10-18 ENCOUNTER — Ambulatory Visit: Payer: 59 | Attending: Internal Medicine

## 2019-10-18 DIAGNOSIS — Z23 Encounter for immunization: Secondary | ICD-10-CM

## 2019-10-18 NOTE — Progress Notes (Signed)
   Covid-19 Vaccination Clinic  Name:  Mildred Bollard    MRN: [id number] DOB: [birth year]  10/18/2019  Mr. Clewis was observed post Covid-19 immunization for 15 minutes without incident. He was provided with Vaccine Information Sheet and instruction to access the V-Safe system.   Mr. Habermann was instructed to call 911 with any severe reactions post vaccine: Marland Kitchen Difficulty breathing  . Swelling of face and throat  . A fast heartbeat  . A bad rash all over body  . Dizziness and weakness   Immunizations Administered    Name Date Dose VIS Date Route   Pfizer COVID-19 Vaccine 10/18/2019  8:36 AM 0.3 mL 08/25/2018 Intramuscular   Manufacturer: [employer], [employer]   Lot: [id number]   NDC: [address]-7

## 2019-11-07 ENCOUNTER — Other Ambulatory Visit: Payer: Self-pay | Admitting: Family Medicine

## 2019-11-08 ENCOUNTER — Other Ambulatory Visit: Payer: Self-pay

## 2019-11-08 MED ORDER — CARBIDOPA-LEVODOPA 25-100 MG PO TABS: ORAL_TABLET | ORAL | 0 refills | Status: DC

## 2019-11-08 NOTE — Telephone Encounter (Signed)
Rx(s) sent to pharmacy electronically.  

## 2019-11-18 ENCOUNTER — Other Ambulatory Visit: Payer: Self-pay | Admitting: Family Medicine

## 2019-12-08 ENCOUNTER — Other Ambulatory Visit: Payer: Self-pay

## 2019-12-08 MED ORDER — CARBIDOPA-LEVODOPA 25-100 MG PO TABS: ORAL_TABLET | ORAL | 0 refills | Status: DC

## 2019-12-13 ENCOUNTER — Other Ambulatory Visit: Payer: Self-pay | Admitting: Family Medicine

## 2019-12-23 ENCOUNTER — Ambulatory Visit: Payer: 59 | Admitting: Neurology

## 2020-01-06 ENCOUNTER — Other Ambulatory Visit: Payer: Self-pay

## 2020-01-06 NOTE — Telephone Encounter (Signed)
Patient canceled his last appt. Is this ok to refill?

## 2020-01-07 ENCOUNTER — Other Ambulatory Visit: Payer: Self-pay | Admitting: Neurology

## 2020-01-07 NOTE — Telephone Encounter (Signed)
Patient called in and left a voicemail needing a refill on his carbidopa-levodopa at the [facility] on [address].

## 2020-01-10 MED ORDER — CARBIDOPA-LEVODOPA 25-100 MG PO TABS: ORAL_TABLET | ORAL | 0 refills | Status: DC

## 2020-01-10 NOTE — Telephone Encounter (Signed)
Left detailed message informing patient that he must have a follow up appt to continue receiving medication refills. Rx(s) sent to pharmacy electronically.  Advised patient to contact office with any questions or concerns.

## 2020-01-10 NOTE — Telephone Encounter (Signed)
Medication was declined last week as he had no appointment here at all.  Looks like appt was just made on Friday so you can fill until he has appt

## 2020-01-10 NOTE — Telephone Encounter (Signed)
[facility]urse 01/08/20 @ 8:37AM:  "Caller states he needs refill of Carbidopa levodopa for Parkinson's. He is out.   Nurse assessment: Reports no new or worsening symptoms.  Called pharmacy: spoke with [facility] @ [phone]. Pharmacist states she will give caller a loaner dose. Will notify caller."

## 2020-02-01 NOTE — Progress Notes (Signed)
Assessment/Plan:   1.  Parkinsons Disease  -Continue carbidopa/levodopa 25/100, 2 tablets at 7 AM/2 tablets at 11 AM/1 tablet at 4 PM  -Continue rotigotine patch, 4 mg daily. No compusive behaviors after starting.  No site reactions  -discussed dbs in detail  2.  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  He wants to try botox  3.  Sialorrhea  -This is commonly associated with PD.  We talked about treatments.  The patient is not a candidate for oral anticholinergic therapy because of increased risk of confusion and falls.  We discussed Botox (type A and B) and 1% atropine drops.  We discusssed that candy like lemon drops can help by stimulating mm of the oropharynx to induce swallowing.  Wants to hold on tx  4.  History of alcoholism and history of Ativan dependency  -Patient actively involved with AA and goes 2-3 times per week.     Subjective:   Kenneth Solomon was seen today in follow up for Parkinsons disease.  My previous records were reviewed prior to todays visit as well as outside records available to me.  After a lengthy discussion last visit, we cautiously started the rotigotine patch.  I was somewhat leery because of history of chemical dependency.  Patient reports today that he continues to actively engage with AA meetings and is sober.  In regards to effectiveness, he reports that the patch is "working well" but he still has some tremor.  His R arm aches but not enough to take medication.  pt denies falls.  Pt denies lightheadedness, near syncope.  No hallucinations.  Mood has been good.  Continues to do RSB 3 days a week.  Some drooling.  Current prescribed movement disorder medications: Carbidopa/levodopa 25/100, 2 tablets at 7 AM/2 tablets at 11 AM/1 tablet at 4 PM Neupro patch, 4 mg daily   ALLERGIES:  No Known Allergies  CURRENT MEDICATIONS:  Outpatient Encounter Medications as of 02/03/2020  Medication Sig  . atorvastatin  (LIPITOR) 40 MG tablet take 1 tablet by mouth once daily  . carbidopa-levodopa (SINEMET IR) 25-100 MG tablet TAKE 2 TABLETS BY MOUTH AT 7 AM, 2 TABLETS AT 11 AM AND 1 TABLET AT 4 PM  . cholecalciferol (VITAMIN D3) 25 MCG (1000 UNIT) tablet Take 1,000 Units by mouth daily.  Marland Kitchen escitalopram (LEXAPRO) 10 MG tablet TAKE 1 TABLET(10 MG) BY MOUTH DAILY  . Multiple Vitamin (MULTIVITAMIN) tablet Take by mouth.  . olmesartan (BENICAR) 20 MG tablet TAKE 1 TABLET(20 MG) BY MOUTH DAILY  . propranolol (INDERAL) 40 MG tablet TAKE 1/2 TABLET BY MOUTH TWICE DAILY FOR TREMOR  . rotigotine (NEUPRO) 4 MG/24HR Place 1 patch onto the skin daily.  . [DISCONTINUED] carbidopa-levodopa (SINEMET IR) 25-100 MG tablet 2 tablets at 7am, 2 tablets at 11 am, and 1 at 4pm   No facility-administered encounter medications on file as of 02/03/2020.    Objective:   PHYSICAL EXAMINATION:    VITALS:   Vitals:   02/03/20 0815  BP: 116/78  Pulse: (!) 59  SpO2: 96%  Weight: 204 lb (92.5 kg)  Height: 5\' 11"  (1.803 m)    GEN:  The patient appears stated age and is in NAD. HEENT:  Normocephalic, atraumatic.  The mucous membranes are moist. The superficial temporal arteries are without ropiness or tenderness. CV:  RRR Lungs:  CTAB Neck/HEME:  There are no carotid bruits bilaterally.  Neurological examination:  Orientation: The patient is alert and  oriented x3. Cranial nerves: There is good facial symmetry with minimal facial hypomimia. The speech is fluent and clear. Soft palate rises symmetrically and there is no tongue deviation. Hearing is intact to conversational tone. Sensation: Sensation is intact to light touch throughout Motor: Strength is at least antigravity x4.  Movement examination: Tone: There is mild increased tone in the RUE  Abnormal movements: there is RUE rest tremor.  There is RUE dystonia. Coordination:  There is no decremation with RAM's,  Gait and Station: The patient has no difficulty arising out  of a deep-seated chair without the use of the hands. The patient's stride length is good but there is decreased arm swing on the R and slightly drags the R leg.    I have reviewed and interpreted the following labs independently    Chemistry      Component Value Date/Time   NA 136 09/21/2019 1021   K 4.5 09/21/2019 1021   CL 101 09/21/2019 1021   CO2 31 09/21/2019 1021   BUN 18 09/21/2019 1021   CREATININE 0.96 09/21/2019 1021      Component Value Date/Time   CALCIUM 9.7 09/21/2019 1021   ALKPHOS 61 09/21/2019 1021   AST 17 09/21/2019 1021   ALT 10 09/21/2019 1021   BILITOT 0.8 09/21/2019 1021        Total time spent on today's visit was 40 minutes, including both face-to-face time and nonface-to-face time.  Time included that spent on review of records (prior notes available to me/labs/imaging if pertinent), discussing treatment and goals, answering patient's questions and coordinating care.  Cc:  [place], Betty G, MD

## 2020-02-02 ENCOUNTER — Other Ambulatory Visit: Payer: Self-pay | Admitting: Neurology

## 2020-02-02 NOTE — Telephone Encounter (Signed)
Rx(s) sent to pharmacy electronically.  

## 2020-02-03 ENCOUNTER — Other Ambulatory Visit: Payer: Self-pay

## 2020-02-03 ENCOUNTER — Ambulatory Visit: Payer: 59 | Admitting: Neurology

## 2020-02-03 ENCOUNTER — Encounter: Payer: Self-pay | Admitting: Neurology

## 2020-02-03 VITALS — BP 116/78 | HR 59 | Ht 71.0 in | Wt 204.0 lb

## 2020-02-03 DIAGNOSIS — K117 Disturbances of salivary secretion: Secondary | ICD-10-CM | POA: Diagnosis not present

## 2020-02-03 DIAGNOSIS — G249 Dystonia, unspecified: Secondary | ICD-10-CM | POA: Diagnosis not present

## 2020-02-03 DIAGNOSIS — G20 Parkinson's disease: Secondary | ICD-10-CM | POA: Diagnosis not present

## 2020-02-03 DIAGNOSIS — F1021 Alcohol dependence, in remission: Secondary | ICD-10-CM

## 2020-02-03 MED ORDER — CARBIDOPA-LEVODOPA 25-100 MG PO TABS: ORAL_TABLET | ORAL | 1 refills | Status: DC

## 2020-02-03 NOTE — Patient Instructions (Addendum)
I will send a referral to Dr. Wynn Banker: Address: [address], [place], [place] [address] Phone: [phone]  The physicians and staff at [facility] are committed to providing excellent care. You may receive a survey requesting feedback about your experience at our office. We strive to receive "very good" responses to the survey questions. If you feel that your experience would prevent you from giving the office a "very good " response, please contact our office to try to remedy the situation. We may be reached at [phone]. Thank you for taking the time out of your busy day to complete the survey.

## 2020-02-04 ENCOUNTER — Encounter: Payer: Self-pay | Admitting: Physical Medicine & Rehabilitation

## 2020-02-16 ENCOUNTER — Other Ambulatory Visit: Payer: Self-pay | Admitting: Family Medicine

## 2020-02-25 ENCOUNTER — Other Ambulatory Visit: Payer: Self-pay

## 2020-02-25 ENCOUNTER — Encounter: Payer: Self-pay | Admitting: Physical Medicine & Rehabilitation

## 2020-02-25 ENCOUNTER — Encounter: Payer: 59 | Attending: Physical Medicine & Rehabilitation | Admitting: Physical Medicine & Rehabilitation

## 2020-02-25 VITALS — BP 120/75 | HR 62 | Temp 98.1°F | Ht 71.0 in | Wt 201.0 lb

## 2020-02-25 DIAGNOSIS — G248 Other dystonia: Secondary | ICD-10-CM

## 2020-02-25 NOTE — Progress Notes (Signed)
Subjective:    Patient ID: Kenneth Solomon, male    DOB: June 03, 1956, 64 y.o.   MRN: [id number] Consult requested by Dr. Lurena Joiner Tat HPI 64 year old male referred by neurology for the  evaluation of right upper extremity dystonia Parkinson's diagnosed in 2018 Rigidity and tremor RIght arm, symptoms are slowly increasing Has weakness and pain in the right wrist and to a lesser extent the right shoulder  No neck pain.  No numbness or tingling in the right ar.  No left arm problems.  Right toes curl Symptoms improve with rock steady boxing .   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 which has helped his tremors overall  Function Difficult to write with RUE but this does not trigger muscle spasm Right hand grip feels weak Feeds himself, brushes teeth with left hand Pain Inventory Average Pain 4 Pain Right Now 3 My pain is aching  LOCATION OF PAIN  Shoulder, elbow, wrist, hand, fingers  BOWEL Number of stools per week: 10 Oral laxative use No  Type of laxative n/a Enema or suppository use No  History of colostomy No  Incontinent No   BLADDER Normal In and out cath, frequency n/a  Able to self cath n/a Bladder incontinence No  Frequent urination No  Leakage with coughing No  Difficulty starting stream No  Incomplete bladder emptying No    Mobility walk without assistance how many minutes can you walk? 60 ability to climb steps?  yes do you drive?  yes transfers alone Do you have any goals in this area?  yes  Function retired  Neuro/Psych tremor  Prior Studies new  Physicians involved in your care new   Family History  Problem Relation Age of Onset  . Hyperlipidemia Mother   . Hypertension Mother   . Stroke Mother   . Heart disease Father   . Cancer Sister        Leukemia  . Heart disease Paternal Aunt    Social History   Socioeconomic History  . Marital status: Divorced    Spouse name: Not on file  . Number of children: 4  . Years of  education: Not on file  . Highest education level: Bachelor's degree (e.g., BA, AB, BS)  Occupational History  . Not on file  Tobacco Use  . Smoking status: Never Smoker  . Smokeless tobacco: Current User    Types: Snuff  Vaping Use  . Vaping Use: Never used  Substance and Sexual Activity  . Alcohol use: Not Currently    Comment: recovering alcoholic just finish up last session of AA  . Drug use: Never  . Sexual activity: Not on file  Other Topics Concern  . Not on file  Social History Narrative  . Not on file   Social Determinants of Health   Financial Resource Strain:   . Difficulty of Paying Living Expenses: Not on file  Food Insecurity:   . Worried About Programme researcher, broadcasting/film/video in the Last Year: Not on file  . Ran Out of Food in the Last Year: Not on file  Transportation Needs:   . Lack of Transportation (Medical): Not on file  . Lack of Transportation (Non-Medical): Not on file  Physical Activity:   . Days of Exercise per Week: Not on file  . Minutes of Exercise per Session: Not on file  Stress:   . Feeling of Stress : Not on file  Social Connections:   . Frequency of Communication with Friends and Family: Not  on file  . Frequency of Social Gatherings with Friends and Family: Not on file  . Attends Religious Services: Not on file  . Active Member of Clubs or Organizations: Not on file  . Attends Banker Meetings: Not on file  . Marital Status: Not on file   Past Surgical History:  Procedure Laterality Date  . WISDOM TOOTH EXTRACTION     Past Medical History:  Diagnosis Date  . Alcoholism /alcohol abuse [facility])    Attending AA meetings.  . Anxiety   . Depression   . Hyperlipidemia   . Hypertension    BP 120/75   Pulse 62   Temp 98.1 F (36.7 C)   Ht 5\' 11"  (1.803 m)   Wt 201 lb (91.2 kg)   SpO2 96%   BMI 28.03 kg/m   Opioid Risk Score:   Fall Risk Score:  `1  Depression screen PHQ 2/9  Depression screen PHQ 2/9 09/21/2019  Decreased  Interest 0  Down, Depressed, Hopeless 0  PHQ - 2 Score 0  Some encounter information is confidential and restricted. Go to Review Flowsheets activity to see all data.    Review of Systems  Constitutional: Negative.   HENT: Negative.   Eyes: Negative.   Respiratory: Negative.   Cardiovascular: Negative.   Gastrointestinal: Negative.   Endocrine: Negative.   Genitourinary: Negative.   Musculoskeletal: Negative.   Skin: Negative.   Allergic/Immunologic: Negative.   Neurological: Negative.   Hematological: Negative.   Psychiatric/Behavioral: Negative.   All other systems reviewed and are negative.      Objective:   Physical Exam Vitals and nursing note reviewed.  Constitutional:      Appearance: Normal appearance.  HENT:     Head: Normocephalic and atraumatic.  Eyes:     Extraocular Movements: Extraocular movements intact.     Conjunctiva/sclera: Conjunctivae normal.     Pupils: Pupils are equal, round, and reactive to light.  Cardiovascular:     Rate and Rhythm: Regular rhythm.     Pulses: Normal pulses.     Heart sounds: Normal heart sounds. No murmur heard.   Pulmonary:     Effort: Pulmonary effort is normal. No respiratory distress.     Breath sounds: Normal breath sounds. No stridor.  Abdominal:     General: Abdomen is flat. Bowel sounds are normal. There is no distension.     Palpations: Abdomen is soft.  Musculoskeletal:     Cervical back: Normal range of motion.     Comments: Mild limitation right shoulder flexion and abduction no pain, negative impingement signs No pain with elbow wrist or finger range of motion.  Skin:    General: Skin is warm and dry.  Neurological:     Mental Status: He is alert and oriented to person, place, and time.     Comments: Motor strength is 5/5 bilateral deltoid bicep tricep finger flexors and extensors. Tone no evidence of clonus at the fingers on the right side no evidence of clonus at the wrist. MAS 0 at the elbow flexors  wrist flexors finger flexors There is a 3 to 5 Hz tremor right forearm with pronation.  There is also a tremor of wrist flexion on the right side. Fingersextended on the right side  Psychiatric:        Mood and Affect: Mood normal.        Behavior: Behavior normal.    Left upper extremity has a mild resting tremor no other abnormal posturing  Assessment & Plan:  1.  History of Parkinson's disease with focal dystonia right upper extremity.  He is on medication for Parkinson's disease.  Would not recommend baclofen or tizanidine  We will trial botulinum toxin injection to the following muscle groups Right pronator teres 25 units Right pronator quadratus 25 units Right palmaris longus 25 units Right FCR 25 units Discussed potential need for dosage or muscle group selection changes based on  injection results.  Would recommend a minimum of 3 injection cycles to adequately assess efficacy

## 2020-02-25 NOTE — Patient Instructions (Signed)
OnabotulinumtoxinA injection (Medical Use) What is this medicine? ONABOTULINUMTOXINA (o na BOTT you lye num tox in eh) is a neuro-muscular blocker. This medicine is used to treat crossed eyes, eyelid spasms, severe neck muscle spasms, ankle and toe muscle spasms, and elbow, wrist, and finger muscle spasms. It is also used to treat excessive underarm sweating, to prevent chronic migraine headaches, and to treat loss of bladder control due to neurologic conditions such as multiple sclerosis or spinal cord injury. This medicine may be used for other purposes; ask your health care provider or pharmacist if you have questions. COMMON BRAND NAME(S): Botox What should I tell my health care provider before I take this medicine? They need to know if you have any of these conditions:  breathing problems  cerebral palsy spasms  difficulty urinating  heart problems  history of surgery where this medicine is going to be used  infection at the site where this medicine is going to be used  myasthenia gravis or other neurologic disease  nerve or muscle disease  surgery plans  take medicines that treat or prevent blood clots  thyroid problems  an unusual or allergic reaction to botulinum toxin, albumin, other medicines, foods, dyes, or preservatives  pregnant or trying to get pregnant  breast-feeding How should I use this medicine? This medicine is for injection into a muscle. It is given by a health care professional in a hospital or clinic setting. Talk to your pediatrician regarding the use of this medicine in children. While this drug may be prescribed for children as young as 2 years old for selected conditions, precautions do apply. Overdosage: If you think you have taken too much of this medicine contact a poison control center or emergency room at once. NOTE: This medicine is only for you. Do not share this medicine with others. What if I miss a dose? This does not apply. What may  interact with this medicine?  aminoglycoside antibiotics like gentamicin, neomycin, tobramycin  muscle relaxants  other botulinum toxin injections This list may not describe all possible interactions. Give your health care provider a list of all the medicines, herbs, non-prescription drugs, or dietary supplements you use. Also tell them if you smoke, drink alcohol, or use illegal drugs. Some items may interact with your medicine. What should I watch for while using this medicine? Visit your doctor for regular check ups. This medicine will cause weakness in the muscle where it is injected. Tell your doctor if you feel unusually weak in other muscles. Get medical help right away if you have problems with breathing, swallowing, or talking. This medicine might make your eyelids droop or make you see blurry or double. If you have weak muscles or trouble seeing do not drive a car, use machinery, or do other dangerous activities. This medicine contains albumin from human blood. It may be possible to pass an infection in this medicine, but no cases have been reported. Talk to your doctor about the risks and benefits of this medicine. If your activities have been limited by your condition, go back to your regular routine slowly after treatment with this medicine. What side effects may I notice from receiving this medicine? Side effects that you should report to your doctor or health care professional as soon as possible:  allergic reactions like skin rash, itching or hives, swelling of the face, lips, or tongue  breathing problems  changes in vision  chest pain or tightness  eye irritation, pain  fast, irregular heartbeat    infection  numbness  speech problems  swallowing problems  unusual weakness Side effects that usually do not require medical attention (report to your doctor or health care professional if they continue or are bothersome):  bruising or pain at site where  injected  drooping eyelid  dry eyes or mouth  headache  muscles aches, pains  sensitivity to light  tearing This list may not describe all possible side effects. Call your doctor for medical advice about side effects. You may report side effects to FDA at 1-800-FDA-1088. Where should I keep my medicine? This drug is given in a hospital or clinic and will not be stored at home. NOTE: This sheet is a summary. It may not cover all possible information. If you have questions about this medicine, talk to your doctor, pharmacist, or health care provider.  2020 Elsevier/Gold Standard (2017-12-22 14:21:42)  

## 2020-03-10 ENCOUNTER — Encounter: Payer: 59 | Admitting: Physical Medicine & Rehabilitation

## 2020-03-23 ENCOUNTER — Other Ambulatory Visit: Payer: Self-pay | Admitting: Neurology

## 2020-03-23 MED ORDER — ROTIGOTINE 4 MG/24HR TD PT24: 1.0000 | MEDICATED_PATCH | Freq: Every day | TRANSDERMAL | 5 refills | Status: DC

## 2020-03-23 NOTE — Telephone Encounter (Signed)
Rx(s) sent to pharmacy electronically.  

## 2020-05-19 ENCOUNTER — Other Ambulatory Visit: Payer: Self-pay | Admitting: Family Medicine

## 2020-06-17 ENCOUNTER — Other Ambulatory Visit: Payer: Self-pay | Admitting: Family Medicine

## 2020-07-19 ENCOUNTER — Other Ambulatory Visit: Payer: Self-pay | Admitting: Neurology

## 2020-07-19 NOTE — Telephone Encounter (Signed)
Yes, 90 days ok

## 2020-07-20 MED ORDER — CARBIDOPA-LEVODOPA 25-100 MG PO TABS: ORAL_TABLET | ORAL | 0 refills | Status: DC

## 2020-08-07 NOTE — Progress Notes (Unsigned)
Assessment/Plan:   1.  Parkinsons Disease  -Continue carbidopa/levodopa 25/100, 2/2/1  -Continue rotigotine patch, 4 mg daily.  No evidence of compulsive behaviors after starting.  Watching closely.  -will try opicapone, 50 mg q hs.  R/B/SE were discussed.  The opportunity to ask questions was given and they were answered to the best of my ability.  The patient expressed understanding and willingness to follow the outlined treatment protocols.  -discussed dbs therapy  2.  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 Wynn Banker and Botox was recommended.  He did not proceed with that.  3.  History of alcoholism/Ativan dependency  -Patient involved with AA.  Subjective:   Kenneth Solomon was seen today in follow up for Parkinsons disease.  My previous records were reviewed prior to todays visit as well as outside records available to me. Pt denies falls.  Pt denies lightheadedness, near syncope.  No hallucinations.  Mood has been good.  Is exercising with RSB.    Last visit, we sent him to Dr. Wynn Banker for evaluation for Botox for focal dystonia.  He had consultation on August 27.  Notes are reviewed.  It does not appear that the patient never returned for Botox, although he did have an appointment scheduled for September, 2021.  Pt states that he just didn't feel ready for it.  Current prescribed movement disorder medications: Carbidopa/levodopa 25/100, 2 tablets at 7 AM/2 tablets at 11 AM/1 tablet at 4 PM - may wear off 1-2 hours before next dose.   Neupro patch, 4 mg daily    ALLERGIES:  No Known Allergies  CURRENT MEDICATIONS:  Outpatient Encounter Medications as of 08/09/2020  Medication Sig  . atorvastatin (LIPITOR) 40 MG tablet take 1 tablet by mouth once daily  . carbidopa-levodopa (SINEMET IR) 25-100 MG tablet TAKE 2 TABLETS BY MOUTH AT 7 AM, 2 TABLETS AT 11 AM AND 1 TABLET AT 4 PM  . cholecalciferol (VITAMIN D3) 25 MCG (1000 UNIT) tablet Take  1,000 Units by mouth daily.  Marland Kitchen escitalopram (LEXAPRO) 10 MG tablet TAKE 1 TABLET(10 MG) BY MOUTH DAILY  . Multiple Vitamin (MULTIVITAMIN) tablet Take 1 tablet by mouth daily.  Marland Kitchen olmesartan (BENICAR) 20 MG tablet TAKE 1 TABLET(20 MG) BY MOUTH DAILY  . propranolol (INDERAL) 40 MG tablet TAKE 1/2 TABLET BY MOUTH TWICE DAILY FOR TREMOR  . rotigotine (NEUPRO) 4 MG/24HR Place 1 patch onto the skin daily.   No facility-administered encounter medications on file as of 08/09/2020.    Objective:   PHYSICAL EXAMINATION:    VITALS:   Vitals:   08/09/20 0851  BP: 104/60  Pulse: (!) 55  SpO2: 98%  Weight: 210 lb (95.3 kg)  Height: 5' 11.25" (1.81 m)    GEN:  The patient appears stated age and is in NAD. HEENT:  Normocephalic, atraumatic.  The mucous membranes are moist. The superficial temporal arteries are without ropiness or tenderness. CV:  RRR Lungs:  CTAB Neck/HEME:  There are no carotid bruits bilaterally.  Neurological examination:  Orientation: The patient is alert and oriented x3. Cranial nerves: There is good facial symmetry with mild facial hypomimia. The speech is fluent and clear.  He is not hypophonic.  Soft palate rises symmetrically and there is no tongue deviation. Hearing is intact to conversational tone. Sensation: Sensation is intact to light touch throughout Motor: Strength is at least antigravity x4.  Movement examination: Tone: There is nl tone in the UE/LE Abnormal movements: the r  hand is dystonic.  There is mild RUE rest tremor.  Some jaw tremor with distraction. Coordination:  There is no decremation with RAM's, with any form of RAMS, including alternating supination and pronation of the forearm, hand opening and closing, finger taps, heel taps and toe taps. Gait and Station: The patient has no difficulty arising out of a deep-seated chair without the use of the hands. The patient's stride length is good with mild dragging of the heels bilaterally.    I have  reviewed and interpreted the following labs independently    Chemistry      Component Value Date/Time   NA 136 09/21/2019 1021   K 4.5 09/21/2019 1021   CL 101 09/21/2019 1021   CO2 31 09/21/2019 1021   BUN 18 09/21/2019 1021   CREATININE 0.96 09/21/2019 1021      Component Value Date/Time   CALCIUM 9.7 09/21/2019 1021   ALKPHOS 61 09/21/2019 1021   AST 17 09/21/2019 1021   ALT 10 09/21/2019 1021   BILITOT 0.8 09/21/2019 1021         Total time spent on today's visit was 30 minutes, including both face-to-face time and nonface-to-face time.  Time included that spent on review of records (prior notes available to me/labs/imaging if pertinent), discussing treatment and goals, answering patient's questions and coordinating care.  Cc:  [place], Betty G, MD

## 2020-08-09 ENCOUNTER — Ambulatory Visit: Payer: 59 | Admitting: Neurology

## 2020-08-09 ENCOUNTER — Encounter: Payer: Self-pay | Admitting: Neurology

## 2020-08-09 ENCOUNTER — Other Ambulatory Visit: Payer: Self-pay

## 2020-08-09 VITALS — BP 104/60 | HR 55 | Ht 71.25 in | Wt 210.0 lb

## 2020-08-09 DIAGNOSIS — G249 Dystonia, unspecified: Secondary | ICD-10-CM | POA: Diagnosis not present

## 2020-08-09 DIAGNOSIS — G20 Parkinson's disease: Secondary | ICD-10-CM | POA: Diagnosis not present

## 2020-08-09 MED ORDER — OPICAPONE 50 MG PO CAPS: 50.0000 mg | ORAL_CAPSULE | Freq: Every day | ORAL | 0 refills | Status: DC

## 2020-08-09 MED ORDER — OPICAPONE 50 MG PO CAPS: 50.0000 mg | ORAL_CAPSULE | Freq: Every day | ORAL | 3 refills | Status: DC

## 2020-08-09 NOTE — Addendum Note (Signed)
Addended by: Kandice Robinsons T on: 08/09/2020 10:41 AM   Modules accepted: Orders

## 2020-08-09 NOTE — Patient Instructions (Signed)
1.  Start opicapone 50 mg at bedtime.  Give me some feedback on this medication 2.  Continue your neupro patch and levodopa as previous 3.  Consider surgical interventions for the future as we discussed  The physicians and staff at [facility] are committed to providing excellent care. You may receive a survey requesting feedback about your experience at our office. We strive to receive "very good" responses to the survey questions. If you feel that your experience would prevent you from giving the office a "very good " response, please contact our office to try to remedy the situation. We may be reached at [phone]. Thank you for taking the time out of your busy day to complete the survey.

## 2020-08-10 ENCOUNTER — Encounter: Payer: Self-pay | Admitting: Neurology

## 2020-08-10 NOTE — Progress Notes (Signed)
Received letter from [employer] that Kenneth Solomon is not a covered drug with patients insurance. I let Tee know and she is going to give me fax number for assistance program so they can have a copy of the denial.

## 2020-08-24 ENCOUNTER — Other Ambulatory Visit: Payer: Self-pay | Admitting: Family Medicine

## 2020-08-29 ENCOUNTER — Telehealth: Payer: Self-pay | Admitting: Neurology

## 2020-08-29 NOTE — Telephone Encounter (Signed)
Spoke with pharmacy who states the rx was received but the medication is not covered by the patients insurance.   Prior Berkley Harvey will be started and rx denied. Will wait to hear back from patient assistance.

## 2020-08-29 NOTE — Telephone Encounter (Signed)
Patient made aware that:   1. Dr Tat sent a prescription to the pharmacy with additional refills  2. His insurance does not cover the prescription and a prior authorization has bee completed but was denied.   3. The Patient assistance form that he signed at his last appointment was faxed and we are waiting to hear back. Advised patient to be on the lookout for a call from patient assistance.   4. I advised patient that we do not have any more samples and the rep came by today and hopefully we will get some on Thursday.   5. I also advised patient that he could contact his pharmacy and see how much this medication would cost him out of pocket and he could see if he could afford it.   Patient voice understanding.

## 2020-08-29 NOTE — Telephone Encounter (Signed)
Patient wants to get a RX for the ongentys [facility] the [facility]. He was asking about the cost he states that he was told that she would be working on getting the cost down for him please

## 2020-09-06 ENCOUNTER — Telehealth: Payer: Self-pay

## 2020-09-06 NOTE — Telephone Encounter (Signed)
Left message for patient asking if he has heard back from patient assistance for his Ongentys.    Advised patient to contact office.

## 2020-09-06 NOTE — Telephone Encounter (Signed)
Received voicemail from patient returning my call.   Spoke with patient who states he has not heard back from patient assistance. Informed patient that we received samples from the company. Advised patient that I could put two weeks of samples upfront for him to pick up. He stated he would swing by the office and pick up those samples today. Samples placed up front for pick up.

## 2020-09-27 ENCOUNTER — Telehealth: Payer: Self-pay | Admitting: Neurology

## 2020-09-27 NOTE — Telephone Encounter (Signed)
Patient called in wanting to see if there was an update on getting assistance for Ongentys. He says he is out of samples.

## 2020-09-28 NOTE — Telephone Encounter (Signed)
Spoke with patient and let him know that I would look into where we are in the process with his Ogentys.   Spoke with prior Research scientist (physical sciences) and she states we are waiting to hear back from patient assistance. She states we should hear something back by next because April 2nd is on a Saturday.   Made patient aware. Two week supply of samples have been placed upfront for patient to pick up. Patient voiced understanding.

## 2020-10-09 ENCOUNTER — Other Ambulatory Visit: Payer: Self-pay

## 2020-10-09 ENCOUNTER — Telehealth: Payer: Self-pay | Admitting: Neurology

## 2020-10-09 MED ORDER — CARBIDOPA-LEVODOPA 25-100 MG PO TABS: ORAL_TABLET | ORAL | 0 refills | Status: DC

## 2020-10-09 NOTE — Telephone Encounter (Signed)
Patient left a VM stating that we would not refill one of the medication and he wants to know why please call he does not know which medication it is

## 2020-10-09 NOTE — Telephone Encounter (Signed)
Tee, please find out about this.  Ask rep as he has [facility]

## 2020-10-09 NOTE — Telephone Encounter (Signed)
Left message for rep seeking advice.

## 2020-10-09 NOTE — Telephone Encounter (Signed)
Refill for carbidopa sent in. Pt asking about an update on new medication that Dr tat wanted to start ongenty

## 2020-10-10 NOTE — Telephone Encounter (Signed)
Patient notified of status of patient assistance and agreeable with samples being placed up front for pick up.   Samples will be placed up front for pickup.   Patient voiced understanding.

## 2020-10-10 NOTE — Telephone Encounter (Signed)
Received message from rep stating that if submit a copy of both denial letters (appeal and prior authorization) along with a completed patient assistance form then the patient will qualify for the medication.   Spoke with prior Research scientist (physical sciences) and she stated we would receive the appeal denial letter via fax.    Is it ok to give the patient another two week of samples.

## 2020-10-10 NOTE — Telephone Encounter (Signed)
ok 

## 2020-10-15 ENCOUNTER — Other Ambulatory Visit: Payer: Self-pay | Admitting: Neurology

## 2020-10-16 ENCOUNTER — Telehealth: Payer: Self-pay | Admitting: Neurology

## 2020-10-16 NOTE — Telephone Encounter (Signed)
Per pt chart refills sent the pharmacy this morning by provider at 8:51 am wit 2 refills.   Pt to call the pharmacy back to make sure refills received.

## 2020-10-16 NOTE — Telephone Encounter (Signed)
Patient called in stating the pharmacy told him we had denied the refill of his Neupro. He would like to find out why and what he should do?

## 2020-11-08 ENCOUNTER — Telehealth: Payer: Self-pay | Admitting: Neurology

## 2020-11-08 NOTE — Telephone Encounter (Signed)
Per pt he received a letter from the company stating he wasn't approved the assisatnce due lack of information sent.   They still need the appeal letter stating it was denied.    Dr.Tat is okay for the pt to come by the office to pick up samples.  After reviewing the chart we never received the Appeal denial through fax.

## 2020-11-08 NOTE — Telephone Encounter (Signed)
Patient called and said he is now completely out of his Ongentis (? Dosage) samples. He said he is okay without it for two days or so but doesn't want to go too long.  Patient needs help with this as soon as possible. I think he said he has been trying to get patient assistance with the medication.

## 2020-11-09 NOTE — Telephone Encounter (Signed)
Patient called back and left a voice mail requesting a call back from the medical assistant that was helping his with this.

## 2020-11-09 NOTE — Telephone Encounter (Signed)
Telephone call to pt, Advised pt to look and see what denial did he receive. If it is the Appeal we can send that as well as the PA denial to the company to see if they will approve the assistance. If it is just the PA then we need to get to sending a Appeal and receive that Denail and we send both denials to ogentys.

## 2020-11-09 NOTE — Telephone Encounter (Signed)
Who is working on this denial?  Can we call the company that makes ongentys (please call Victorino Dike our rep) and his insurance?  We can certainly give him some samples but won't have lifetime samples.

## 2020-11-09 NOTE — Telephone Encounter (Signed)
Per pt he did not receive the denial letter from insurance.   So we will have to start a appeal then send the letter to Kenneth Solomon so he can hopefully get assistance

## 2020-11-12 ENCOUNTER — Other Ambulatory Visit: Payer: Self-pay | Admitting: Family Medicine

## 2020-11-29 ENCOUNTER — Telehealth: Payer: Self-pay | Admitting: Neurology

## 2020-11-29 MED ORDER — CARBIDOPA-LEVODOPA 25-100 MG PO TABS: ORAL_TABLET | ORAL | 0 refills | Status: DC

## 2020-11-29 NOTE — Telephone Encounter (Signed)
  1. Which medications need to be refilled? (please list name of each medication and dose if known) Carbidopa-levodopa  2. Which pharmacy/location (including street and city if local pharmacy) is medication to be sent to? [facility]

## 2020-11-29 NOTE — Telephone Encounter (Signed)
Refill has been sent and patient and been notified.

## 2020-12-06 ENCOUNTER — Telehealth: Payer: Self-pay

## 2020-12-08 ENCOUNTER — Telehealth: Payer: Self-pay | Admitting: Neurology

## 2020-12-08 NOTE — Telephone Encounter (Signed)
This is currently been worked on by [employer], she will return on Monday.

## 2020-12-08 NOTE — Telephone Encounter (Signed)
Close encounter 

## 2020-12-08 NOTE — Telephone Encounter (Signed)
Daughter Marchelle Folks called in and needs a phone call regarding her dads medicine. She said [facility] [phone] ) told her we need to fill out some paper work for medicine ongentix ( spelling ? ) she said she has been trying for months to get this filled. She isn't sure if they need an auth #, date it was [place], or provider TIN. She was also told they need to subit an appeal or grievance.

## 2020-12-11 ENCOUNTER — Encounter: Payer: Self-pay | Admitting: Neurology

## 2020-12-11 NOTE — Progress Notes (Unsigned)
Subjective:    Patient ID: Kenneth Solomon is a 65 y.o. male.  Per [facility] call Ref# [id number] 12/11/2020 To request appeal  for  Ongentys Cap 50 mg fax clinical notes, labs, ins info to [phone] Appeal faxed 12/11/2020 - waiting on reply      {Common ambulatory SmartLinks:[id number]}  Review of Systems  Objective:  Neurological Exam  Physical Exam  Assessment:    Plan:

## 2020-12-11 NOTE — Telephone Encounter (Signed)
No answer to daughter at 12/11/2020 at 9:22am

## 2020-12-11 NOTE — Telephone Encounter (Signed)
Patient is okay on samples for now, will touch base next week. Form is working on by [employer] today

## 2020-12-19 ENCOUNTER — Telehealth: Payer: Self-pay | Admitting: Internal Medicine

## 2020-12-19 NOTE — Telephone Encounter (Signed)
Spoke to Abby on 12/19/20 @ [phone] [facility]) (call ref# [id number])  Appeal Case ID [id number] The appeal was submitted on 12/11/2020 and routed to  Case Manager for review and is currently pending We should have a response on or before December 26, 2020.

## 2020-12-19 NOTE — Telephone Encounter (Signed)
Patient spoke to Ducor at [employer] and expressed his frustration that he has not heard back about his Ogent[employer]. Patient states he has one week left and needs more.

## 2020-12-22 ENCOUNTER — Other Ambulatory Visit: Payer: Self-pay | Admitting: Family Medicine

## 2021-01-08 ENCOUNTER — Other Ambulatory Visit: Payer: Self-pay | Admitting: Family Medicine

## 2021-01-11 NOTE — Progress Notes (Signed)
Telephone call to [facility] spoke to Summer Ref # [id number], appeal Denied. Will Fax over a copy of the Denial.  Once received fax a copy of the PA and Appeal Denial to [employer] with the Assistance form.

## 2021-01-20 ENCOUNTER — Other Ambulatory Visit: Payer: Self-pay | Admitting: Neurology

## 2021-01-24 NOTE — Progress Notes (Signed)
Assessment/Plan:   1.  Parkinsons Disease  -Continue carbidopa/levodopa 25/100, 2/2/1  -Continue rotigotine patch, 4 mg daily.  No evidence of compulsive behaviors after starting.  Watching closely.  -continue Opicapone, 50 mg daily.  Samples given.  Called the company again today and they said that they hope to have a determination today on whether or not he qualifies for the assist.    -discussed dbs  -no evidence memory change  2.  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 Wynn Banker and Botox was recommended.  He did not proceed with that.  3.  History of alcoholism/Ativan dependency  -Patient involved with AA.  PDMP has been reviewed and there is nothing concerning  Subjective:   Kenneth Solomon was seen today in follow up for Parkinsons disease.  My previous records were reviewed prior to todays visit as well as outside records available to me. Pt denies falls.  Pt denies lightheadedness, near syncope.  No hallucinations. No compulsive behavior.   Mood has been good.  Is exercising with [facility].    Last visit, opicapone was started for off episodes.  Insurance denied it.  As part of the appeal process, this has to be denied on 2 levels, and the patient needs to bring [place] the denials, and then the company will help to pick up the cost of the medication.  Unfortunately, the patient did not bring me the denial letters, so I was not able to proceed with the appeal process with the company.  The patient became very frustrated with our office.  It was explained to the patient that we would need the denial letters that he received from his insurance.  He did not have those at the time, so we restarted the appeal process.  In the meantime, we have been following the patient samples.  He has been out of it for 3-4 weeks.  He is now realizing it was helpful.  It originally worked really well (for off).  Wife notes some slower processing speed.    Current prescribed  movement disorder medications: Carbidopa/levodopa 25/100, 2 tablets at 7 AM/2 tablets at 11 AM/1 tablet at 4 PM - may wear off 1-2 hours before next dose.   Neupro patch, 4 mg daily Opicapone, 50 mg nightly   ALLERGIES:  No Known Allergies  CURRENT MEDICATIONS:  Outpatient Encounter Medications as of 01/26/2021  Medication Sig   atorvastatin (LIPITOR) 40 MG tablet take 1 tablet by mouth once daily   carbidopa-levodopa (SINEMET IR) 25-100 MG tablet TAKE 2 TABLETS BY MOUTH AT 7 AM, 2 TABLETS AT 11 AM AND 1 TABLET AT 4 PM   cholecalciferol (VITAMIN D3) 25 MCG (1000 UNIT) tablet Take 1,000 Units by mouth daily.   escitalopram (LEXAPRO) 10 MG tablet TAKE 1 TABLET(10 MG) BY MOUTH DAILY   Multiple Vitamin (MULTIVITAMIN) tablet Take 1 tablet by mouth daily.   NEUPRO 4 MG/24HR APPLY 1 PATCH TOPICALLY TO THE SKIN DAILY   olmesartan (BENICAR) 20 MG tablet TAKE 1 TABLET(20 MG) BY MOUTH DAILY   propranolol (INDERAL) 40 MG tablet TAKE 1/2 TABLET BY MOUTH TWICE DAILY FOR TREMORS   Opicapone 50 MG CAPS Take 50 mg by mouth daily. (Patient not taking: Reported on 01/26/2021)   No facility-administered encounter medications on file as of 01/26/2021.    Objective:   PHYSICAL EXAMINATION:    VITALS:   Vitals:   01/26/21 0808  BP: 110/78  Pulse: 67  SpO2: 97%  Weight:  218 lb 3.2 oz (99 kg)  Height: 5\' 11"  (1.803 m)     GEN:  The patient appears stated age and is in NAD. HEENT:  Normocephalic, atraumatic.  The mucous membranes are moist. The superficial temporal arteries are without ropiness or tenderness. CV:  RRR Lungs:  CTAB Neck/HEME:  There are no carotid bruits bilaterally.  Neurological examination:  Orientation: The patient is alert and oriented x3. Cranial nerves: There is good facial symmetry with mild facial hypomimia. The speech is fluent and clear.  He is not hypophonic.  Soft palate rises symmetrically and there is no tongue deviation. Hearing is intact to conversational  tone. Sensation: Sensation is intact to light touch throughout Motor: Strength is at least antigravity x4.  Movement examination: Tone: There is nl tone in the UE/LE Abnormal movements: the r hand is dystonic.  There is mild RUE rest tremor.  Some jaw tremor with distraction. Coordination:  There is very mild decremation with finger taps on the right. Gait and Station: The patient has no difficulty arising out of a deep-seated chair without the use of the hands. The patient's stride length is good with mild dragging of the heels bilaterally.  Neg pull test  I have reviewed and interpreted the following labs independently    Chemistry      Component Value Date/Time   NA 136 09/21/2019 1021   K 4.5 09/21/2019 1021   CL 101 09/21/2019 1021   CO2 31 09/21/2019 1021   BUN 18 09/21/2019 1021   CREATININE 0.96 09/21/2019 1021      Component Value Date/Time   CALCIUM 9.7 09/21/2019 1021   ALKPHOS 61 09/21/2019 1021   AST 17 09/21/2019 1021   ALT 10 09/21/2019 1021   BILITOT 0.8 09/21/2019 1021         Total time spent on today's visit was 30 minutes, including both face-to-face time and nonface-to-face time.  Time included that spent on review of records (prior notes available to me/labs/imaging if pertinent), discussing treatment and goals, answering patient's questions and coordinating care.  Cc:  09/23/2019, Betty G, MD

## 2021-01-26 ENCOUNTER — Other Ambulatory Visit: Payer: Self-pay

## 2021-01-26 ENCOUNTER — Ambulatory Visit: Payer: 59 | Admitting: Neurology

## 2021-01-26 ENCOUNTER — Encounter: Payer: Self-pay | Admitting: Neurology

## 2021-01-26 ENCOUNTER — Telehealth: Payer: Self-pay

## 2021-01-26 VITALS — BP 110/78 | HR 67 | Ht 71.0 in | Wt 218.2 lb

## 2021-01-26 DIAGNOSIS — G20 Parkinson's disease: Secondary | ICD-10-CM

## 2021-01-26 DIAGNOSIS — G249 Dystonia, unspecified: Secondary | ICD-10-CM | POA: Diagnosis not present

## 2021-01-26 DIAGNOSIS — G20A1 Parkinson's disease without dyskinesia, without mention of fluctuations: Secondary | ICD-10-CM

## 2021-01-26 MED ORDER — CARBIDOPA-LEVODOPA 25-100 MG PO TABS: ORAL_TABLET | ORAL | 1 refills | Status: DC

## 2021-01-26 MED ORDER — NEUPRO 4 MG/24HR TD PT24: 1.0000 | MEDICATED_PATCH | Freq: Every day | TRANSDERMAL | 1 refills | Status: DC

## 2021-01-26 NOTE — Telephone Encounter (Signed)
Called Kendell Bane who is the Reimbursement coordinator at [phone] on 01/17/21 at 1:16pm and left a message to inquire about the status of patients appeal.    Wandra Scot [phone] today at 8:20am to check on status of patients appeal and was informed that the approval team in still working on the appeal and that we might hear something by the end of the day.

## 2021-02-06 ENCOUNTER — Other Ambulatory Visit: Payer: Self-pay | Admitting: Family Medicine

## 2021-03-07 ENCOUNTER — Telehealth: Payer: Self-pay | Admitting: Neurology

## 2021-03-07 NOTE — Telephone Encounter (Signed)
Returned call to [facility] and they also emailed me to let me know they have reached out to patient on three separate occasions and patients brother Loraine Leriche on delivery of his medication. I reached out to patient as well and left a voicemail message to please call back so he can receive his medication

## 2021-03-07 NOTE — Telephone Encounter (Signed)
Beth from [place] called and left a voice mail that they have been unable to reach the patient about [facility].  She'd like a call back about this.

## 2021-03-11 ENCOUNTER — Other Ambulatory Visit: Payer: Self-pay | Admitting: Family Medicine

## 2021-03-19 ENCOUNTER — Other Ambulatory Visit: Payer: Self-pay | Admitting: Family Medicine

## 2021-04-11 ENCOUNTER — Other Ambulatory Visit: Payer: Self-pay | Admitting: Family Medicine

## 2021-05-07 ENCOUNTER — Other Ambulatory Visit: Payer: Self-pay | Admitting: Family Medicine

## 2021-06-08 ENCOUNTER — Other Ambulatory Visit: Payer: Self-pay | Admitting: Family Medicine

## 2021-06-29 ENCOUNTER — Telehealth: Payer: Self-pay | Admitting: Neurology

## 2021-06-29 ENCOUNTER — Other Ambulatory Visit: Payer: Self-pay

## 2021-06-29 ENCOUNTER — Other Ambulatory Visit: Payer: Self-pay | Admitting: Neurology

## 2021-06-29 DIAGNOSIS — G20 Parkinson's disease: Secondary | ICD-10-CM

## 2021-06-29 MED ORDER — ROTIGOTINE 4 MG/24HR TD PT24: MEDICATED_PATCH | TRANSDERMAL | 0 refills | Status: DC

## 2021-06-29 NOTE — Telephone Encounter (Signed)
Pt called in stating he currently doesn't have insurance. He is in the process of getting Medicare, but for now his prescriptions are very expensive. He would like to make sure it isn't dangerous for him to stop taking Neupro?

## 2021-06-29 NOTE — Telephone Encounter (Signed)
Called patient and asked him to come by office to fill out paperwork for the [employer] assistance program to receive his medications . Patient coming by today to fill out paperwork and I will get it faxed ASAP Can I give patient samples to hold him over until we can get approved ?

## 2021-07-04 ENCOUNTER — Other Ambulatory Visit: Payer: Self-pay

## 2021-07-04 MED ORDER — NEUPRO 4 MG/24HR TD PT24: 1.0000 | MEDICATED_PATCH | Freq: Every day | TRANSDERMAL | 1 refills | Status: DC

## 2021-07-04 NOTE — Telephone Encounter (Signed)
Called patient and let him know of the change in medication. Patient wants to think about it today and will call me back and let me know

## 2021-07-10 ENCOUNTER — Other Ambulatory Visit: Payer: Self-pay | Admitting: Family Medicine

## 2021-07-11 ENCOUNTER — Other Ambulatory Visit: Payer: Self-pay | Admitting: Family Medicine

## 2021-07-23 ENCOUNTER — Other Ambulatory Visit: Payer: Self-pay | Admitting: Family Medicine

## 2021-07-24 ENCOUNTER — Telehealth: Payer: Self-pay | Admitting: Licensed Clinical Social Worker

## 2021-07-24 NOTE — Progress Notes (Signed)
Kenneth Solomon is a 66 y.o.male, who is here today for follow up.  Last follow up visit: 09/21/19 Since his last visit he has followed with neurologist for parkinson's disease.  Hypertension:  Medications:olmesartan 20 mg daily. He also takes Propranolol 40 mg bid for tremors. BP readings at home:Not checking. Side effects:None. Negative for unusual or severe headache, visual changes, exertional chest pain, dyspnea,  focal weakness, or edema.  Lab Results  Component Value Date   CREATININE 0.96 09/21/2019   BUN 18 09/21/2019   NA 136 09/21/2019   K 4.5 09/21/2019   CL 101 09/21/2019   CO2 31 09/21/2019   Hyperlipidemia: He is not longer on Atorvastatin, ran out. Side effects from medication:None.  Lab Results  Component Value Date   CHOL 214 (H) 09/21/2019   HDL 45.30 09/21/2019   LDLCALC 144 (H) 09/21/2019   TRIG 125.0 09/21/2019   CHOLHDL 5 09/21/2019   Alcoholism in remission.  Has not had any alcohol in 4 years. AA meeting 2 times per month.  Depression and anxiety, he is on Lexapro 10 mg, which he has taken for a few years. Medication is still helping. No side effects reported.  No hx of DM, his glucose has been mildly elevated in the past. Negative for polydipsia,polyuria, or polyphagia.  Depression screen [facility] 2/9 07/25/2021 02/25/2020 09/21/2019  Decreased Interest 0 0 0  Down, Depressed, Hopeless 0 0 0  PHQ - 2 Score 0 0 0  Altered sleeping - 0 -  Tired, decreased energy - 1 -  Change in appetite - 1 -  Feeling bad or failure about yourself  - 0 -  Trouble concentrating - 1 -  Moving slowly or fidgety/restless - 2 -  Suicidal thoughts - 0 -  PHQ-9 Score - 5 -  Some encounter information is confidential and restricted. Go to Review Flowsheets activity to see all data.   Review of Systems  Constitutional:  Positive for fatigue. Negative for activity change, appetite change and fever.  HENT:  Negative for nosebleeds, sore throat and trouble  swallowing.   Respiratory:  Negative for cough and wheezing.   Gastrointestinal:  Negative for abdominal pain, nausea and vomiting.  Genitourinary:  Negative for decreased urine volume, dysuria and hematuria.  Neurological:  Positive for tremors. Negative for syncope and facial asymmetry.  Psychiatric/Behavioral:  Negative for confusion and hallucinations.   Rest see pertinent positives and negatives per HPI.  Current Outpatient Medications on File Prior to Visit  Medication Sig Dispense Refill   atorvastatin (LIPITOR) 40 MG tablet take 1 tablet by mouth once daily     carbidopa-levodopa (SINEMET IR) 25-100 MG tablet TAKE 2 TABLETS BY MOUTH AT 7:00 AM, 2 TABLETS AT 11:00 AM, AND 1 TABLET AT 4:00 PM 300 tablet 0   cholecalciferol (VITAMIN D3) 25 MCG (1000 UNIT) tablet Take 1,000 Units by mouth daily.     Multiple Vitamin (MULTIVITAMIN) tablet Take 1 tablet by mouth daily.     Opicapone 50 MG CAPS Take 50 mg by mouth daily. 30 capsule 3   rotigotine (NEUPRO) 4 MG/24HR Samples of this drug were given to the patient, quantity 1, Lot Number [id number] D Exp 7/23 Samples of this drug were given to the patient, quantity 2, Lot Number [id number] D Exp 10/23 30 patch 0   rotigotine (NEUPRO) 4 MG/24HR Place 1 patch onto the skin daily. 90 patch 1   No current facility-administered medications on file prior to visit.     Past Medical  History:  Diagnosis Date   Alcoholism /alcohol abuse    Attending [place] meetings.   Anxiety    Depression    Hyperlipidemia    Hypertension     No Known Allergies  Social History   Socioeconomic History   Marital status: Divorced    Spouse name: Not on file   Number of children: 4   Years of education: Not on file   Highest education level: Bachelor's degree (e.g., BA, AB, BS)  Occupational History   Not on file  Tobacco Use   Smoking status: Never   Smokeless tobacco: Current    Types: Snuff  Vaping Use   Vaping Use: Never used  Substance and Sexual  Activity   Alcohol use: Not Currently    Comment: recovering alcoholic just finish up last session of AA   Drug use: Never   Sexual activity: Not on file  Other Topics Concern   Not on file  Social History Narrative   Right handed but uses left hand more because of tremors    Social Determinants of Health   Financial Resource Strain: Not on file  Food Insecurity: Not on file  Transportation Needs: Not on file  Physical Activity: Not on file  Stress: Not on file  Social Connections: Not on file   Vitals:   07/25/21 0853  BP: 130/78  Pulse: 77  Resp: 16   Body mass index is 30.58 kg/m.  Physical Exam Nursing note reviewed.  Constitutional:      General: He is not in acute distress.    Appearance: He is well-developed.  HENT:     Head: Normocephalic and atraumatic.     Mouth/Throat:     Mouth: Mucous membranes are moist.     Dentition: Abnormal dentition.  Eyes:     Conjunctiva/sclera: Conjunctivae normal.  Cardiovascular:     Rate and Rhythm: Normal rate and regular rhythm.     Pulses:          Dorsalis pedis pulses are 2+ on the right side and 2+ on the left side.     Heart sounds: No murmur heard. Pulmonary:     Effort: Pulmonary effort is normal. No respiratory distress.     Breath sounds: Normal breath sounds.  Abdominal:     Palpations: Abdomen is soft. There is no hepatomegaly or mass.     Tenderness: There is no abdominal tenderness.  Lymphadenopathy:     Cervical: No cervical adenopathy.  Skin:    General: Skin is warm.     Findings: No erythema or rash.  Neurological:     Mental Status: He is alert and oriented to person, place, and time.     Cranial Nerves: No cranial nerve deficit.     Motor: Tremor (Right UE mainly, at rest.) present.  Psychiatric:     Comments: Well groomed, good eye contact.   ASSESSMENT AND PLAN:   [name] was seen today for follow-up.  Diagnoses and all orders for this visit:  Orders Placed This Encounter   Procedures   Pneumococcal polysaccharide vaccine 23-valent greater than or equal to 2yo subcutaneous/IM   Comprehensive metabolic panel   Lipid panel   Hemoglobin A1c   Lab Results  Component Value Date   HGBA1C 6.2 07/25/2021   Lab Results  Component Value Date   CREATININE 1.07 07/25/2021   BUN 19 07/25/2021   NA 139 07/25/2021   K 4.0 07/25/2021   CL 101 07/25/2021   CO2 31 07/25/2021  Lab Results  Component Value Date   ALT 8 07/25/2021   AST 21 07/25/2021   ALKPHOS 54 07/25/2021   BILITOT 0.8 07/25/2021   Lab Results  Component Value Date   CHOL 228 (H) 07/25/2021   HDL 42.70 07/25/2021   LDLCALC 161 (H) 07/25/2021   TRIG 122.0 07/25/2021   CHOLHDL 5 07/25/2021   The 10-year ASCVD risk score (Arnett DK, et al., 2019) is: 18%   Values used to calculate the score:     Age: 30 years     Sex: Male     Is Non-Hispanic African American: No     Diabetic: No     Tobacco smoker: No     Systolic Blood Pressure: [id number] mmHg     Is BP treated: Yes     HDL Cholesterol: 42.7 mg/dL     Total Cholesterol: 228 mg/dL  Hyperglycemia A healthy life style encouraged for diabetes prevention. Further recommendations according to HgA1C result.  Tremor Following with neurologist. Medication is still helping, continue Propranolol 40 mg bid.  -     propranolol (INDERAL) 40 MG tablet; Take 1 tablet (40 mg total) by mouth 2 (two) times daily.  Need for pneumococcal vaccination -     Pneumococcal polysaccharide vaccine 23-valent greater than or equal to 2yo subcutaneous/IM  Anxiety disorder, unspecified type Problem is stable. Continue Lexapro 10 mg daily.  -     escitalopram (LEXAPRO) 10 MG tablet; TAKE 1 TABLET(10 MG) BY MOUTH DAILY  Parkinson disease ([place]) Following with neurologist every 6 months.  Hyperlipidemia He is not longer on Atorvastatin. Non pharmacologic treatment recommended for now. Further recommendations will be given according to 10 years CVD risk  score and lipid panel numbers.  Essential hypertension BP adequately controlled. Continue current management: Olmesartan 20 mg and Propranolol 40 mg bid. DASH/low salt diet to continue.  Alcohol dependence in remission [facility]) Has been in remission for 4-5 year . Continue attending [place] meetings.  Following with neuro every 6 months, so I think it is appropriate to continua annual follow up.  Return in about 1 year (around 07/21/2022) for follow up, before depending of lab results..  Sangeeta Youse G. [place], MD  [facility]. [place] office.

## 2021-07-24 NOTE — Telephone Encounter (Signed)
LCSW received message from dtr Marchelle Folks) of patient regarding insurance and medications. LCSW left message and she returned call later in the day.  Pt's Dtr Marchelle Folks at [phone] stated that her father's insurance from the city stopped December 31st and he has applied for medicare .  He is waiting on notification regarding medicare.  Pt has not been taking meds recently according to her due to insurance stopping.  LCSW referred her to a SHIP case Production designer, theatre/television/film for medicare questions and to look at good RX for prices for self pay options.  In addition, she may need  to contact primary doctor of those other non-parkinson's meds for any samples or options.  In regards to his patch or Parkinson's meds will relay message to Dr. Don Perking CMA assistant to see if appropriate for samples .  Pt is scheduled for follow up with Dr. January 31st.

## 2021-07-25 ENCOUNTER — Ambulatory Visit (INDEPENDENT_AMBULATORY_CARE_PROVIDER_SITE_OTHER): Payer: Self-pay | Admitting: Family Medicine

## 2021-07-25 ENCOUNTER — Telehealth: Payer: Self-pay | Admitting: Neurology

## 2021-07-25 ENCOUNTER — Encounter: Payer: Self-pay | Admitting: Family Medicine

## 2021-07-25 VITALS — BP 130/78 | HR 77 | Resp 16 | Ht 71.0 in | Wt 219.2 lb

## 2021-07-25 DIAGNOSIS — F1021 Alcohol dependence, in remission: Secondary | ICD-10-CM

## 2021-07-25 DIAGNOSIS — E785 Hyperlipidemia, unspecified: Secondary | ICD-10-CM

## 2021-07-25 DIAGNOSIS — F419 Anxiety disorder, unspecified: Secondary | ICD-10-CM | POA: Insufficient documentation

## 2021-07-25 DIAGNOSIS — Z23 Encounter for immunization: Secondary | ICD-10-CM

## 2021-07-25 DIAGNOSIS — R739 Hyperglycemia, unspecified: Secondary | ICD-10-CM

## 2021-07-25 DIAGNOSIS — R251 Tremor, unspecified: Secondary | ICD-10-CM

## 2021-07-25 DIAGNOSIS — I10 Essential (primary) hypertension: Secondary | ICD-10-CM

## 2021-07-25 DIAGNOSIS — G20 Parkinson's disease: Secondary | ICD-10-CM

## 2021-07-25 LAB — LIPID PANEL
Cholesterol: 228 mg/dL — ABNORMAL HIGH (ref 0–200)
HDL: 42.7 mg/dL (ref >39.00)
LDL Cholesterol: 161 mg/dL — ABNORMAL HIGH (ref 0–99)
NonHDL: 185.31
Total CHOL/HDL Ratio: 5
Triglycerides: 122 mg/dL (ref 0.0–149.0)
VLDL: 24.4 mg/dL (ref 0.0–40.0)

## 2021-07-25 LAB — COMPREHENSIVE METABOLIC PANEL
ALT: 8 U/L (ref 0–53)
AST: 21 U/L (ref 0–37)
Albumin: 4.5 g/dL (ref 3.5–5.2)
Alkaline Phosphatase: 54 U/L (ref 39–117)
BUN: 19 mg/dL (ref 6–23)
CO2: 31 mEq/L (ref 19–32)
Calcium: 9.8 mg/dL (ref 8.4–10.5)
Chloride: 101 mEq/L (ref 96–112)
Creatinine, Ser: 1.07 mg/dL (ref 0.40–1.50)
GFR: 73.02 mL/min (ref >60.00)
Glucose, Bld: 111 mg/dL — ABNORMAL HIGH (ref 70–99)
Potassium: 4 mEq/L (ref 3.5–5.1)
Sodium: 139 mEq/L (ref 135–145)
Total Bilirubin: 0.8 mg/dL (ref 0.2–1.2)
Total Protein: 7.8 g/dL (ref 6.0–8.3)

## 2021-07-25 LAB — HEMOGLOBIN A1C: Hgb A1c MFr Bld: 6.2 % (ref 4.6–6.5)

## 2021-07-25 MED ORDER — OLMESARTAN MEDOXOMIL 20 MG PO TABS: ORAL_TABLET | ORAL | 3 refills | Status: AC

## 2021-07-25 MED ORDER — ESCITALOPRAM OXALATE 10 MG PO TABS: ORAL_TABLET | ORAL | 3 refills | Status: AC

## 2021-07-25 MED ORDER — PROPRANOLOL HCL 40 MG PO TABS: 40.0000 mg | ORAL_TABLET | Freq: Two times a day (BID) | ORAL | 3 refills | Status: AC

## 2021-07-25 NOTE — Assessment & Plan Note (Signed)
Has been in remission for 4-5 year . Continue attending AA meetings.

## 2021-07-25 NOTE — Telephone Encounter (Signed)
Called patient and left message that Dr. Arbutus Leas would like to discuss the medication at the upcoming appt

## 2021-07-25 NOTE — Assessment & Plan Note (Addendum)
BP adequately controlled. Continue current management: Olmesartan 20 mg and Propranolol 40 mg bid. DASH/low salt diet to continue.

## 2021-07-25 NOTE — Telephone Encounter (Signed)
Patient needs a call back to discuss a refill on his rotigotine. He is having a hard time this morning. [place] Spaniel he is in between insurances, and is willing to pay

## 2021-07-25 NOTE — Assessment & Plan Note (Signed)
He is not longer on Atorvastatin. Non pharmacologic treatment recommended for now. Further recommendations will be given according to 10 years CVD risk score and lipid panel numbers.

## 2021-07-25 NOTE — Patient Instructions (Addendum)
A few things to remember from today's visit:   Essential hypertension - Plan: Comprehensive metabolic panel, propranolol (INDERAL) 40 MG tablet, olmesartan (BENICAR) 20 MG tablet  Hyperlipidemia, unspecified hyperlipidemia type - Plan: Comprehensive metabolic panel, Lipid panel  Hyperglycemia - Plan: Hemoglobin A1c  Parkinson disease ([place])  Alcohol dependence in remission ([place])  Tremor - Plan: propranolol (INDERAL) 40 MG tablet  If you need refills please call your pharmacy. Do not use My Chart to request refills or for acute issues that need immediate attention.   As far as you are following with neurologist every 6 months and if labs are otherwise normal I think annual follow up is appropriate. No changes in medications today.  Please be sure medication list is accurate. If a new problem present, please set up appointment sooner than planned today.

## 2021-07-25 NOTE — Telephone Encounter (Signed)
Called patients daughter to discuss upcoming appt. Left voicemail

## 2021-07-25 NOTE — Assessment & Plan Note (Signed)
Following with neurologist every 6 months.

## 2021-07-27 NOTE — Progress Notes (Signed)
Assessment/Plan:   1.  Parkinsons Disease  -Continue carbidopa/levodopa 25/100, 2/2/1  -Discussed with patient and daughter that he will not be able to afford the rotigotine and Medicare does not pay for that medication.  -As we had discussed with the patient on the phone, we will need to transition him to pramipexole.  Because he has been off of the rotigotine for so long, we will need to retitrate the pramipexole.  We will work him up to 0.5 mg 3 times per day.  Discussed risks of compulsive behaviors (he did not have that with rotigotine and this is the same class of medicine).  Understanding was expressed.  [place]  2.  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 Wynn BankerKirsteins and Botox was recommended.  He did not proceed with that.  3.  History of alcoholism/Ativan dependency  -Patient involved with AA.  PDMP has been reviewed and there is nothing concerning  4.  Insomnia  -He has some day/night reversal.  Talked about regular sleep schedule.  Talked about having a regular schedule in the day as well.  -Add melatonin 3 mg daily consistently.  5.  We will plan on seeing the patient back in the next 8 weeks to see how he is doing.  Subjective:   Kenneth Solomon was seen today in follow up for Parkinsons disease.  My previous records were reviewed prior to todays visit as well as outside records available to me.  Patient with daughter who supplements history.  At last visit, patient was a little bit frustrated about difficulty receiving opicapone, but some of the issue was an insurance issue (and him getting us the proper paperwork).  That had been received after our last visit and we got multiple phone calls from the company that supplies the medication stating that they reached out to the patient and could not get a hold of him.  We also reached out to the patient and had difficulty reaching him.  Unfortunately, by the very  end of the year, the patient had lost his insurance and wanted to know about the next step.  I told him he would need to stop the rotigotine because of cost and started him on pramipexole because it should be much cheaper.  In addition, he was applying for Medicare and Medicare was not going to pay for the rotigotine.  This was on January 2.  Patient ultimately stated that he wanted to think about it and would call us back if he wanted to make the change.  Unfortunately, he did not call us back until his daughter called back on January 25 asking for samples of rotigotine (which he had not been taking because he had been out of it).  Since being off of the medication, he noted more speech trouble and more trouble communicating.  He went to PCP 2 days ago and she filled his other meds and he is feeling better (he had been off all of his meds - lexapro, propranolol, Benicar).  Larey SeatFell 1 time in the last 6 months.  Tripped over the trash can while taking trash out to street.  No hallucinations.  Some visual distortions.    Current prescribed movement disorder medications: Carbidopa/levodopa 25/100, 2 tablets at 7 AM/2 tablets at 11 AM/1 tablet at 4 PM - may wear off 1-2 hours before next dose.   Neupro patch, 4 mg daily Opicapone, 50 mg nightly  Prior medications: Rotigotine  patch; opicapone  ALLERGIES:  No Known Allergies  CURRENT MEDICATIONS:  Outpatient Encounter Medications as of 07/31/2021  Medication Sig   atorvastatin (LIPITOR) 40 MG tablet Take 1 tablet (40 mg total) by mouth daily.   cholecalciferol (VITAMIN D3) 25 MCG (1000 UNIT) tablet Take 1,000 Units by mouth daily.   escitalopram (LEXAPRO) 10 MG tablet TAKE 1 TABLET(10 MG) BY MOUTH DAILY   Multiple Vitamin (MULTIVITAMIN) tablet Take 1 tablet by mouth daily.   olmesartan (BENICAR) 20 MG tablet TAKE 1 TABLET(20 MG) BY MOUTH DAILY   propranolol (INDERAL) 40 MG tablet Take 1 tablet (40 mg total) by mouth 2 (two) times daily.    [DISCONTINUED] carbidopa-levodopa (SINEMET IR) 25-100 MG tablet TAKE 2 TABLETS BY MOUTH AT 7:00 AM, 2 TABLETS AT 11:00 AM, AND 1 TABLET AT 4:00 PM   Opicapone 50 MG CAPS Take 50 mg by mouth daily. (Patient not taking: Reported on 07/31/2021)   rotigotine (NEUPRO) 4 MG/24HR Samples of this drug were given to the patient, quantity 1, Lot Number [id number] Exp 7/23 Samples of this drug were given to the patient, quantity 2, Lot Number [id number] D Exp 10/23 (Patient not taking: Reported on 07/31/2021)   rotigotine (NEUPRO) 4 MG/24HR Place 1 patch onto the skin daily. (Patient not taking: Reported on 07/31/2021)   [DISCONTINUED] atorvastatin (LIPITOR) 40 MG tablet take 1 tablet by mouth once daily   No facility-administered encounter medications on file as of 07/31/2021.    Objective:   PHYSICAL EXAMINATION:    VITALS:   Vitals:   07/31/21 0832  BP: 119/62  Pulse: (!) 59  SpO2: 99%  Weight: 217 lb (98.4 kg)  Height: 5\' 11"  (1.803 m)      GEN:  The patient appears stated age and is in NAD. HEENT:  Normocephalic, atraumatic.  The mucous membranes are moist. The superficial temporal arteries are without ropiness or tenderness. CV:  RRR Lungs:  CTAB Neck/HEME:  There are no carotid bruits bilaterally.  Neurological examination:  Orientation: The patient is alert and oriented x3. Cranial nerves: There is good facial symmetry with mild facial hypomimia. The speech is clear but occasionally dysphasic when he is rushing.   he is not hypophonic.  Soft palate rises symmetrically and there is no tongue deviation. Hearing is intact to conversational tone. Sensation: Sensation is intact to light touch throughout Motor: Strength is at least antigravity x4.  Movement examination: Tone: There is nl tone in the UE/LE Abnormal movements: the r hand is dystonic.  There is mild RUE rest tremor.  jaw tremor with distraction. Coordination:  There is very mild decremation with finger taps on the right. Gait  and Station: The patient has no difficulty arising out of a deep-seated chair without the use of the hands. The patient ambulates fairly well.  He has purposeful arm swing.  I have reviewed and interpreted the following labs independently    Chemistry      Component Value Date/Time   NA 139 07/25/2021 0943   K 4.0 07/25/2021 0943   CL 101 07/25/2021 0943   CO2 31 07/25/2021 0943   BUN 19 07/25/2021 0943   CREATININE 1.07 07/25/2021 0943      Component Value Date/Time   CALCIUM 9.8 07/25/2021 0943   ALKPHOS 54 07/25/2021 0943   AST 21 07/25/2021 0943   ALT 8 07/25/2021 0943   BILITOT 0.8 07/25/2021 0943         Total time spent on today's visit was 32 minutes, including both  face-to-face time and nonface-to-face time.  Time included that spent on review of records (prior notes available to me/labs/imaging if pertinent), discussing treatment and goals, answering patient's questions and coordinating care.  Cc:  [place], Betty G, MD

## 2021-07-29 MED ORDER — ATORVASTATIN CALCIUM 40 MG PO TABS: 40.0000 mg | ORAL_TABLET | Freq: Every day | ORAL | 2 refills | Status: AC

## 2021-07-31 ENCOUNTER — Ambulatory Visit (INDEPENDENT_AMBULATORY_CARE_PROVIDER_SITE_OTHER): Payer: Self-pay | Admitting: Neurology

## 2021-07-31 ENCOUNTER — Encounter: Payer: Self-pay | Admitting: Neurology

## 2021-07-31 ENCOUNTER — Other Ambulatory Visit: Payer: Self-pay

## 2021-07-31 VITALS — BP 119/62 | HR 59 | Ht 71.0 in | Wt 217.0 lb

## 2021-07-31 DIAGNOSIS — G20 Parkinson's disease: Secondary | ICD-10-CM

## 2021-07-31 DIAGNOSIS — G249 Dystonia, unspecified: Secondary | ICD-10-CM

## 2021-07-31 MED ORDER — PRAMIPEXOLE DIHYDROCHLORIDE 0.25 MG PO TABS: ORAL_TABLET | ORAL | 0 refills | Status: DC

## 2021-07-31 MED ORDER — PRAMIPEXOLE DIHYDROCHLORIDE 0.5 MG PO TABS: ORAL_TABLET | ORAL | 1 refills | Status: DC

## 2021-07-31 MED ORDER — CARBIDOPA-LEVODOPA 25-100 MG PO TABS: ORAL_TABLET | ORAL | 1 refills | Status: AC

## 2021-07-31 NOTE — Patient Instructions (Signed)
Start mirapex (pramipexole) as follows:  0.125 mg - 1 tablet three times per day for a week, then 2 tablets three times per day for a week and then fill the 0.5 mg tablet and take that, 1 pill three times per day.  You can take this at same time as levodopa  Continue carbidopa/levodopa 25/100, 2 at 7am, 2 at 11am, 1 at 4pm

## 2021-08-08 ENCOUNTER — Telehealth: Payer: Self-pay | Admitting: Neurology

## 2021-08-08 NOTE — Telephone Encounter (Signed)
ERROR

## 2021-08-08 NOTE — Telephone Encounter (Signed)
Stacey from [facility], pt assistance program needs an RX sent in so pt can get medication. They have the application, just need the prescription.  4mg  patches Neupro  Phone [phone] Fax [phone]

## 2021-09-17 ENCOUNTER — Other Ambulatory Visit: Payer: Self-pay | Admitting: Neurology

## 2021-09-17 DIAGNOSIS — G20 Parkinson's disease: Secondary | ICD-10-CM

## 2021-09-17 NOTE — Progress Notes (Signed)
? ? ?Assessment/Plan:  ? ?1.  Parkinsons Disease ? -Continue carbidopa/levodopa 25/100, 2/2/1 ? -Increase the pramipexole to  0.5 mg 3 times per day.  No evidence of compulsive behaviors. ? ?2.  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 -Patient has seen Dr. Wynn Banker and Botox was recommended.  He did not proceed with that. ? ?3.  History of alcoholism/Ativan dependency ? -Patient involved with AA.  PDMP has been reviewed and there is nothing concerning ? ?4.  Insomnia ? -He has some day/night reversal.  Talked about regular sleep schedule.  Talked about having a regular schedule in the day as well. ? -Take melatonin 3 mg daily consistently. ? ?5.  Memory loss ? -we will do neuropsych testing ? ?6.  Follow up 5 months ? ?Subjective:  ? ?Kenneth Solomon was seen today in follow up for Parkinsons disease.  My previous records were reviewed prior to todays visit as well as outside records available to me.  Patient with daughter who supplements history.  Last visit, we started the patient on pramipexole.  He was supposed to titrate up to 0.5 mg tid and then f/u today to discuss. However, he didn't do that (partially because of the move to [place], and he did not have anybody monitoring his medications, but he does now).  He is still on the first week of 0.25 mg tid.  He does think it is helping.  He had to get off of the rotigotine because of cost.  He is tolerating the pramipexole well, without side effects.  No evidence of compulsive behaviors or sleep attacks.  He has moved to [place] since our last visit.  As above, he now has a nursing service that comes in and gives him his second and third dose of levodopa.  The first he is able to manage on his own.  Daughter is concerned about memory change. ? ?Current prescribed movement disorder medications: ?Carbidopa/levodopa 25/100, 2 tablets at 7 AM/2 tablets at 11 AM/1 tablet at 4 PM - may wear off 1-2 hours before next dose.   ?Pramipexole  0.5 mg 3 times per day (started last visit but he hasn't titrated up yet and still on 0.25 mg tid) ?Melatonin, 3 mg nightly  ? ? ?Prior medications: Rotigotine patch (stopped because of cost); opicapone (stopped because of cost) ? ?ALLERGIES:  No Known Allergies ? ?CURRENT MEDICATIONS:  ?Outpatient Encounter Medications as of 09/20/2021  ?Medication Sig  ? atorvastatin (LIPITOR) 40 MG tablet Take 1 tablet (40 mg total) by mouth daily.  ? carbidopa-levodopa (SINEMET IR) 25-100 MG tablet TAKE 2 TABLETS BY MOUTH AT 7:00 AM, 2 TABLETS AT 11:00 AM, AND 1 TABLET AT 4:00 PM  ? cholecalciferol (VITAMIN D3) 25 MCG (1000 UNIT) tablet Take 1,000 Units by mouth daily.  ? escitalopram (LEXAPRO) 10 MG tablet TAKE 1 TABLET(10 MG) BY MOUTH DAILY  ? Multiple Vitamin (MULTIVITAMIN) tablet Take 1 tablet by mouth daily.  ? olmesartan (BENICAR) 20 MG tablet TAKE 1 TABLET(20 MG) BY MOUTH DAILY  ? Opicapone 50 MG CAPS Take 50 mg by mouth daily. (Patient not taking: Reported on 07/31/2021)  ? pramipexole (MIRAPEX) 0.25 MG tablet 1 tablet three times per day for a week, then 2 tablets three times per day for a week  ? pramipexole (MIRAPEX) 0.5 MG tablet Take one tablet three times a day  ? propranolol (INDERAL) 40 MG tablet Take 1 tablet (40 mg total) by mouth 2 (two) times daily.  ? rotigotine (  NEUPRO) 4 MG/24HR Samples of this drug were given to the patient, quantity 1, Lot Number [id number] D Exp 7/23 ?Samples of this drug were given to the patient, quantity 2, Lot Number [id number] D Exp 10/23 (Patient not taking: Reported on 07/31/2021)  ? rotigotine (NEUPRO) 4 MG/24HR Place 1 patch onto the skin daily. (Patient not taking: Reported on 07/31/2021)  ? ?No facility-administered encounter medications on file as of 09/20/2021.  ? ? ?Objective:  ? ?PHYSICAL EXAMINATION:   ? ?VITALS:   ?Vitals:  ? 09/20/21 0822  ?Weight: 215 lb (97.5 kg)  ?Height: 5\' 11"  (1.803 m)  ? ? ? ? ? ?GEN:  The patient appears stated age and is in NAD. ?HEENT:  Normocephalic,  atraumatic.  The mucous membranes are moist.  ? ?Neurological examination: ? ?Orientation: The patient is alert and oriented x3. ?Cranial nerves: There is good facial symmetry with mild facial hypomimia. The speech is clear but occasionally dysphasic when he is rushing.   he is not hypophonic.  Soft palate rises symmetrically and there is no tongue deviation. Hearing is intact to conversational tone. ?Sensation: Sensation is intact to light touch throughout ?Motor: Strength is at least antigravity x4. ? ?Movement examination: ?Tone: There is nl tone in the UE/LE ?Abnormal movements: the r hand is dystonic.  There is rare RUE rest tremor.  jaw tremor with distraction. ?Coordination:  There is no decremation today. ?Gait and Station: The patient has no difficulty arising out of a deep-seated chair without the use of the hands. The patient ambulates fairly well.  He has purposeful arm swing. ? ?I have reviewed and interpreted the following labs independently ? ?  Chemistry   ?   ?Component Value Date/Time  ? NA 139 07/25/2021 0943  ? K 4.0 07/25/2021 0943  ? CL 101 07/25/2021 0943  ? CO2 31 07/25/2021 0943  ? BUN 19 07/25/2021 0943  ? CREATININE 1.07 07/25/2021 0943  ?    ?Component Value Date/Time  ? CALCIUM 9.8 07/25/2021 0943  ? ALKPHOS 54 07/25/2021 0943  ? AST 21 07/25/2021 0943  ? ALT 8 07/25/2021 0943  ? BILITOT 0.8 07/25/2021 0943  ?  ? ? ? ? ? ?Total time spent on today's visit was 30 minutes, including both face-to-face time and nonface-to-face time.  Time included that spent on review of records (prior notes available to me/labs/imaging if pertinent), discussing treatment and goals, answering patient's questions and coordinating care. ? ?Cc:  07/27/2021, Betty G, MD ? ?

## 2021-09-20 ENCOUNTER — Other Ambulatory Visit: Payer: Self-pay

## 2021-09-20 ENCOUNTER — Encounter: Payer: Self-pay | Admitting: Neurology

## 2021-09-20 ENCOUNTER — Telehealth: Payer: Self-pay

## 2021-09-20 ENCOUNTER — Ambulatory Visit (INDEPENDENT_AMBULATORY_CARE_PROVIDER_SITE_OTHER): Payer: Medicare Other | Admitting: Neurology

## 2021-09-20 VITALS — BP 120/70 | HR 53 | Ht 71.0 in | Wt 215.0 lb

## 2021-09-20 DIAGNOSIS — G20 Parkinson's disease: Secondary | ICD-10-CM | POA: Diagnosis not present

## 2021-09-20 DIAGNOSIS — G249 Dystonia, unspecified: Secondary | ICD-10-CM

## 2021-09-20 DIAGNOSIS — R413 Other amnesia: Secondary | ICD-10-CM | POA: Diagnosis not present

## 2021-09-20 NOTE — Patient Instructions (Signed)
You have been referred for a neurocognitive evaluation (i.e., evaluation of memory and thinking abilities). Please bring someone with you to this appointment if possible, as it is helpful for the neuropsychologist to hear from both you and another adult who knows you well. Please bring eyeglasses and hearing aids if you wear them and take any medications as you normally would.    The evaluation will take approximately 2-3 hours and has two parts:   The first part is a clinical interview with the neuropsychologist, Dr. Merz.  During the interview, the neuropsychologist will speak with you and the individual you brought to the appointment.    The second part of the evaluation is testing with the doctor's technician, aka psychometrician, Dana or Kim. During the testing, the technician will ask you to remember different types of material, solve problems, and answer some questionnaires. Your family member will not be present for this portion of the evaluation.   Please note: We have to reserve several hours of the neuropsychologist's time and the psychometrician's time for your evaluation appointment. As such, there is a No-Show fee of $100. If you are unable to attend any of your appointments, please contact our office as soon as possible to reschedule.  

## 2021-09-21 NOTE — Telephone Encounter (Signed)
Error

## 2021-09-24 ENCOUNTER — Telehealth: Payer: Self-pay

## 2021-09-24 NOTE — Telephone Encounter (Addendum)
EMS called and noted patient was shaking a lot, I advised Ems to take patient to Hospital for evaulation and treatment if they thought it was medically necessary per Dr.Tat.Caretaker was on the way to patient's house. Advised to Dr.Tat. Recommendations ER.             ?  ?

## 2021-09-28 NOTE — Progress Notes (Signed)
? ?HPI: ?[name] is a 65 y.o. male with hx of HLD,HTN, parkinson disease,and insomnia here today with her daughter , who is concerned about declining and more frequent episodes of forgetfulness. ?He lives independently and his daughter thinks he needs a higher level of care. He is sometimes forgetting to take his meds, dysarthria getting worse, and trouble with word finding. Some sometimes he uses the wrong word, which makes harder to understand what he is trying to say. Her daughter calls him daily and trying to be sure he takes his meds. Concerned about the possibility of being taking more than prescribed o not taking his meds.  ?  ?It is getting harder for him to keep up with ADL's, he needs assistance for some. ?He has difficulty with eating because coordination, tremor. ?He can dress with no help most of the time if he takes his parkinson medications. ?He has a shower chair. ? ?He follows with Dr Tat every 6 months.  He was last seen on 09/20/2021, pramipexole was adjusted. ?He is also on carbidopa/levodopa 25/100 mg. ? ?Sometimes he sees shadows on his peripheral vision, he knows nobody with him, negative for auditory hallucinations. ? ?Anxiety: Aggravated by physical limitations due to Parkinson's disease. ?He is on Lexapro 10 mg daily. ?Had a panic attack last week, EMS evaluation at home, "everything" was fine but it was suggested to check UTI. ?He denies any new urinary symptom. ?Urinary frequency, which is chronic. ?Nocturia x 3. ?Occasional urine dribbling. ?Negative for dysuria or gross hematuria. ? ?Hard time falling asleep, wakes up a few times. ?Takes naps during the day.Marland Kitchen ?He is not longer on Trazodone. ?OTC melatonin seemed to help. ? ?HTN on propranolol 40 mg twice daily and olmesartan 20 mg daily. ?Negative for severe/frequent headache, visual changes, chest pain, dyspnea,focal weakness, or edema. ?Lab Results  ?Component Value Date  ? CREATININE 1.07 07/25/2021  ? BUN 19 07/25/2021  ?  NA 139 07/25/2021  ? K 4.0 07/25/2021  ? CL 101 07/25/2021  ? CO2 31 07/25/2021  ? ?Review of Systems  ?Constitutional:  Positive for activity change and fatigue. Negative for appetite change and fever.  ?HENT:  Negative for sore throat and trouble swallowing.   ?Respiratory:  Negative for cough and wheezing.   ?Gastrointestinal:  Negative for abdominal pain, nausea and vomiting.  ?     No changes in bowel habits.  ?Endocrine: Negative for cold intolerance and heat intolerance.  ?Musculoskeletal:  Positive for gait problem.  ?Skin:  Negative for rash.  ?Neurological:  Negative for syncope and facial asymmetry.  ?Rest see pertinent positives and negatives per HPI. ? ?Current Outpatient Medications on File Prior to Visit  ?Medication Sig Dispense Refill  ? atorvastatin (LIPITOR) 40 MG tablet Take 1 tablet (40 mg total) by mouth daily. 90 tablet 2  ? carbidopa-levodopa (SINEMET IR) 25-100 MG tablet TAKE 2 TABLETS BY MOUTH AT 7:00 AM, 2 TABLETS AT 11:00 AM, AND 1 TABLET AT 4:00 PM 450 tablet 1  ? cholecalciferol (VITAMIN D3) 25 MCG (1000 UNIT) tablet Take 1,000 Units by mouth daily.    ? escitalopram (LEXAPRO) 10 MG tablet TAKE 1 TABLET(10 MG) BY MOUTH DAILY 90 tablet 3  ? Multiple Vitamin (MULTIVITAMIN) tablet Take 1 tablet by mouth daily.    ? olmesartan (BENICAR) 20 MG tablet TAKE 1 TABLET(20 MG) BY MOUTH DAILY 90 tablet 3  ? pramipexole (MIRAPEX) 0.5 MG tablet Take one tablet three times a day 270 tablet 1  ? propranolol (  INDERAL) 40 MG tablet Take 1 tablet (40 mg total) by mouth 2 (two) times daily. 180 tablet 3  ? ?No current facility-administered medications on file prior to visit.  ? ? ?Past Medical History:  ?Diagnosis Date  ? Alcoholism /alcohol abuse   ? Attending [place] meetings.  ? Anxiety   ? Depression   ? Hyperlipidemia   ? Hypertension   ? ?No Known Allergies ? ?Social History  ? ?Socioeconomic History  ? Marital status: Divorced  ?  Spouse name: Not on file  ? Number of children: 4  ? Years of education:  Not on file  ? Highest education level: Bachelor's degree (e.g., BA, AB, BS)  ?Occupational History  ? Not on file  ?Tobacco Use  ? Smoking status: Never  ? Smokeless tobacco: Current  ?  Types: Snuff  ?Vaping Use  ? Vaping Use: Never used  ?Substance and Sexual Activity  ? Alcohol use: Not Currently  ?  Comment: recovering alcoholic just finish up last session of AA  ? Drug use: Never  ? Sexual activity: Not on file  ?Other Topics Concern  ? Not on file  ?Social History Narrative  ? Right handed but uses left hand more because of tremors   ? ?Social Determinants of Health  ? ?Financial Resource Strain: Not on file  ?Food Insecurity: Not on file  ?Transportation Needs: Not on file  ?Physical Activity: Not on file  ?Stress: Not on file  ?Social Connections: Not on file  ? ?Vitals:  ? 10/01/21 0718  ?BP: 128/70  ?Pulse: 66  ?Resp: 16  ?SpO2: 99%  ? ?Body mass index is 29.78 kg/m?. ? ?Physical Exam ?Nursing note reviewed.  ?Constitutional:   ?   General: He is not in acute distress. ?   Appearance: He is well-developed.  ?HENT:  ?   Head: Normocephalic and atraumatic.  ?   Mouth/Throat:  ?   Mouth: Mucous membranes are moist.  ?   Dentition: Abnormal dentition.  ?Eyes:  ?   Conjunctiva/sclera: Conjunctivae normal.  ?Cardiovascular:  ?   Rate and Rhythm: Normal rate and regular rhythm.  ?   Heart sounds: No murmur heard. ?   Comments: Trace pitting LE edema, bilateral. ?Pulmonary:  ?   Effort: Pulmonary effort is normal. No respiratory distress.  ?   Breath sounds: Normal breath sounds.  ?Abdominal:  ?   Palpations: Abdomen is soft. There is no hepatomegaly or mass.  ?   Tenderness: There is no abdominal tenderness.  ?Lymphadenopathy:  ?   Cervical: No cervical adenopathy.  ?Skin: ?   General: Skin is warm.  ?   Findings: No erythema or rash.  ?Neurological:  ?   Mental Status: He is alert and oriented to person, place, and time.  ?   Cranial Nerves: No cranial nerve deficit.  ?   Motor: Tremor present.  ?    Coordination: Coordination abnormal.  ?   Gait: Gait abnormal (not assisted.).  ? ?ASSESSMENT AND PLAN: ? ?Mr.Clennon was seen today for anxiety. ? ?Diagnoses and all orders for this visit: ?Orders Placed This Encounter  ?Procedures  ? CBC  ? Basic metabolic panel  ? Urinalysis with Culture Reflex  ? QuantiFERON-TB Gold Plus  ? ?Lab Results  ?Component Value Date  ? WBC 8.4 10/01/2021  ? HGB 14.6 10/01/2021  ? HCT 43.8 10/01/2021  ? MCV 91.7 10/01/2021  ? PLT 251.0 10/01/2021  ? ?Lab Results  ?Component Value Date  ? CREATININE  1.14 10/01/2021  ? BUN 25 (H) 10/01/2021  ? NA 139 10/01/2021  ? K 4.3 10/01/2021  ? CL 103 10/01/2021  ? CO2 29 10/01/2021  ? ?Declining functional status ?It is becoming more difficult for him to continue living independently and more challenging for his daughter to help with his care. He needs assistance with some ADL's and help with administering medications. His daughter brought a FL-2 form to be completed. She is still looking for a facility in the area. ?He needs TB screening test done. ? ?Urinary frequency ?Chronic and reporting as stable. ?Most likely caused by BPH. ?PSA in 09/21/19 was normal at 0.4. ?Further recommendations will be given according to UA result. ? ?Screening-pulmonary TB ?-     QuantiFERON-TB Gold Plus ? ?Parkinson disease ([place]) ?Daughter thinks symptoms are getting worse. ?We discussed natural history of Parkinson disease. ?Continue carbidopa/levodopa and pramipexole. ?Following with neurologist. ? ?Insomnia ?Improving sleep hygiene will help, he is taking naps during the day. ?OTC melatonin has helped, so try to take it every night, 2 hours before bedtime. ?Once he is in assisted living, we can consider resuming trazodone if needed for sleep. ? ?Essential hypertension ?BP adequately controlled. ?Continue propranolol and olmesartan same dose. ?Continue low-salt diet. ? ?Anxiety disorder ?Aggravated by Parkinson symptoms. ?For now continue same diose of Lexapro. ?We  can consider medication prn for panic attacks once somebody is administering medications, Hydroxyzine is an option.He took Ativan in the past, hx of dependency, he has not taken it is years. ? ?I spent a total of 4

## 2021-10-01 ENCOUNTER — Ambulatory Visit (INDEPENDENT_AMBULATORY_CARE_PROVIDER_SITE_OTHER): Payer: Medicare Other | Admitting: Family Medicine

## 2021-10-01 ENCOUNTER — Encounter: Payer: Self-pay | Admitting: Family Medicine

## 2021-10-01 VITALS — BP 128/70 | HR 66 | Resp 16 | Ht 71.0 in | Wt 213.5 lb

## 2021-10-01 DIAGNOSIS — G20 Parkinson's disease: Secondary | ICD-10-CM

## 2021-10-01 DIAGNOSIS — I10 Essential (primary) hypertension: Secondary | ICD-10-CM | POA: Diagnosis not present

## 2021-10-01 DIAGNOSIS — F419 Anxiety disorder, unspecified: Secondary | ICD-10-CM

## 2021-10-01 DIAGNOSIS — R350 Frequency of micturition: Secondary | ICD-10-CM

## 2021-10-01 DIAGNOSIS — Z111 Encounter for screening for respiratory tuberculosis: Secondary | ICD-10-CM

## 2021-10-01 DIAGNOSIS — R5381 Other malaise: Secondary | ICD-10-CM | POA: Diagnosis not present

## 2021-10-01 DIAGNOSIS — G4700 Insomnia, unspecified: Secondary | ICD-10-CM

## 2021-10-01 LAB — CBC
HCT: 43.8 % (ref 39.0–52.0)
Hemoglobin: 14.6 g/dL (ref 13.0–17.0)
MCHC: 33.4 g/dL (ref 30.0–36.0)
MCV: 91.7 fl (ref 78.0–100.0)
Platelets: 251 10*3/uL (ref 150.0–400.0)
RBC: 4.77 Mil/uL (ref 4.22–5.81)
RDW: 13 % (ref 11.5–15.5)
WBC: 8.4 10*3/uL (ref 4.0–10.5)

## 2021-10-01 LAB — BASIC METABOLIC PANEL
BUN: 25 mg/dL — ABNORMAL HIGH (ref 6–23)
CO2: 29 mEq/L (ref 19–32)
Calcium: 9.7 mg/dL (ref 8.4–10.5)
Chloride: 103 mEq/L (ref 96–112)
Creatinine, Ser: 1.14 mg/dL (ref 0.40–1.50)
GFR: 67.58 mL/min (ref >60.00)
Glucose, Bld: 107 mg/dL — ABNORMAL HIGH (ref 70–99)
Potassium: 4.3 mEq/L (ref 3.5–5.1)
Sodium: 139 mEq/L (ref 135–145)

## 2021-10-01 NOTE — Assessment & Plan Note (Addendum)
Improving sleep hygiene will help, he is taking naps during the day. ?OTC melatonin has helped, so try to take it every night, 2 hours before bedtime. ?Once he is in assisted living, we can consider resuming trazodone if needed for sleep. ?

## 2021-10-01 NOTE — Patient Instructions (Signed)
A few things to remember from today's visit: ? ? ?Urinary frequency - Plan: CBC, Urinalysis with Culture Reflex ? ?Essential hypertension - Plan: CBC, Basic metabolic panel ? ?Parkinson disease (HCC) ? ?Screening-pulmonary TB - Plan: QuantiFERON-TB Gold Plus ? ?If you need refills please call your pharmacy. ?Do not use My Chart to request refills or for acute issues that need immediate attention. ?  ? ?Please be sure medication list is accurate. ?If a new problem present, please set up appointment sooner than planned today. ? ?Will complete FL2 form and fax it to facility to decide to go. ?Urine frequency most likely due to enlarged prostate. ? ? ? ? ? ?

## 2021-10-01 NOTE — Assessment & Plan Note (Addendum)
Aggravated by Parkinson symptoms. ?For now continue same diose of Lexapro. ?We can consider medication prn for panic attacks once somebody is administering medications, Hydroxyzine is an option.He took Ativan in the past, hx of dependency, he has not taken it is years. ?

## 2021-10-01 NOTE — Assessment & Plan Note (Addendum)
Daughter thinks symptoms are getting worse. ?We discussed natural history of Parkinson disease. ?Continue carbidopa/levodopa and pramipexole. ?Following with neurologist. ?

## 2021-10-01 NOTE — Assessment & Plan Note (Signed)
BP adequately controlled. ?Continue propranolol and olmesartan same dose. ?Continue low-salt diet. ?

## 2021-10-04 LAB — URINALYSIS W MICROSCOPIC + REFLEX CULTURE
Bacteria, UA: NONE SEEN /HPF
Bilirubin Urine: NEGATIVE
Glucose, UA: NEGATIVE
Hgb urine dipstick: NEGATIVE
Hyaline Cast: NONE SEEN /LPF
Leukocyte Esterase: NEGATIVE
Nitrites, Initial: NEGATIVE
RBC / HPF: NONE SEEN /HPF (ref 0–2)
Specific Gravity, Urine: 1.033 (ref 1.001–1.035)
Squamous Epithelial / HPF: NONE SEEN /HPF (ref <=5)
WBC, UA: NONE SEEN /HPF (ref 0–5)
pH: 5.5 (ref 5.0–8.0)

## 2021-10-04 LAB — QUANTIFERON-TB GOLD PLUS
Mitogen-NIL: >10 IU/mL
NIL: 0.18 IU/mL
QuantiFERON-TB Gold Plus: NEGATIVE
TB1-NIL: 0.11 IU/mL
TB2-NIL: <0 IU/mL

## 2021-10-04 LAB — NO CULTURE INDICATED

## 2021-10-12 ENCOUNTER — Telehealth: Payer: Self-pay | Admitting: Family Medicine

## 2021-10-12 NOTE — Telephone Encounter (Signed)
Paperwork received.

## 2021-10-12 NOTE — Telephone Encounter (Signed)
FL2 and Facility forms to be filled out. Placed in dr's folder.  Patient needs a copy of TB shot given at his last visit.  Daughter states she needs to try to have the forms back by Wednesday if possible. ?

## 2021-10-12 NOTE — Telephone Encounter (Signed)
Form filled out & on pcp's desk for signature. 

## 2021-10-15 NOTE — Telephone Encounter (Signed)
Patient's daughter is aware that paperwork is completed & up front for pick up. ?

## 2021-10-16 ENCOUNTER — Telehealth: Payer: Self-pay | Admitting: Family Medicine

## 2021-10-16 DIAGNOSIS — G20 Parkinson's disease: Secondary | ICD-10-CM

## 2021-10-16 NOTE — Telephone Encounter (Signed)
Referral placed.

## 2021-10-16 NOTE — Telephone Encounter (Signed)
Kenneth Solomon Child psychotherapist from [facility] is calling and pt will be entering [facility]F [facility] to have physical therapy adl's and safety . Please put orders in Epic under referral ?

## 2021-10-18 ENCOUNTER — Telehealth: Payer: Self-pay | Admitting: Family Medicine

## 2021-10-18 NOTE — Telephone Encounter (Signed)
[facility] called because they are needing face to face office visit with notes and discharge summary (only if patient has been admitted recently) sent in with referral. The OV notes cannot be older than 30 days. They will need this for every referral going to them from here on out. ? ? ? ? ?Please advise  ?

## 2021-10-19 ENCOUNTER — Emergency Department (HOSPITAL_BASED_OUTPATIENT_CLINIC_OR_DEPARTMENT_OTHER)
Admission: EM | Admit: 2021-10-19 | Discharge: 2021-10-19 | Disposition: A | Discharge status: Home / Self Care | Payer: Medicare Other | Attending: Emergency Medicine | Admitting: Emergency Medicine

## 2021-10-19 ENCOUNTER — Encounter (HOSPITAL_BASED_OUTPATIENT_CLINIC_OR_DEPARTMENT_OTHER): Payer: Self-pay | Admitting: Pediatrics

## 2021-10-19 ENCOUNTER — Other Ambulatory Visit: Payer: Self-pay

## 2021-10-19 DIAGNOSIS — I10 Essential (primary) hypertension: Secondary | ICD-10-CM | POA: Insufficient documentation

## 2021-10-19 DIAGNOSIS — G20 Parkinson's disease: Secondary | ICD-10-CM | POA: Insufficient documentation

## 2021-10-19 DIAGNOSIS — T447X1A Poisoning by beta-adrenoreceptor antagonists, accidental (unintentional), initial encounter: Secondary | ICD-10-CM | POA: Diagnosis present

## 2021-10-19 DIAGNOSIS — T50901A Poisoning by unspecified drugs, medicaments and biological substances, accidental (unintentional), initial encounter: Secondary | ICD-10-CM

## 2021-10-19 NOTE — ED Provider Notes (Signed)
?[facility] EMERGENCY DEPT ?Provider Note ? ? ?CSN: [id number] ?Arrival date & time: 10/19/21  3888 ? ?  ? ?History ? ?Chief Complaint  ?Patient presents with  ? Drug Overdose  ? ? ?Kenneth Solomon is a 66 y.o. male.  Presenting to the emergency department due to concern for taking too much of his regular home medication.  Apparently per EMS report patient was given his routine morning medicine around 7 AM by the facility but then his family gave him his morning medication at 8:15 AM.  They report that up to a total of 80 mg of propanolol has been given (patient normally takes 40 mg in the morning).  Patient did not have any medical complaints, family did not have any other specific medical concerns. ? ?At time of my interview patient denies any complaints. ? ?Review chart, reviewed last PCP note. hx of HLD,HTN, parkinson disease,and insomnia here today with her daughter , who is concerned about declining and more frequent episodes of forgetfulness. ? ?HPI ? ?  ? ?Home Medications ?Prior to Admission medications   ?Medication Sig Start Date End Date Taking? Authorizing Provider  ?atorvastatin (LIPITOR) 40 MG tablet Take 1 tablet (40 mg total) by mouth daily. 07/29/21   [place], Betty G, MD  ?carbidopa-levodopa (SINEMET IR) 25-100 MG tablet TAKE 2 TABLETS BY MOUTH AT 7:00 AM, 2 TABLETS AT 11:00 AM, AND 1 TABLET AT 4:00 PM 07/31/21   Tat, Octaviano Batty, DO  ?cholecalciferol (VITAMIN D3) 25 MCG (1000 UNIT) tablet Take 1,000 Units by mouth daily.    [provider]  ?escitalopram (LEXAPRO) 10 MG tablet TAKE 1 TABLET(10 MG) BY MOUTH DAILY 07/25/21   [place], Betty G, MD  ?Multiple Vitamin (MULTIVITAMIN) tablet Take 1 tablet by mouth daily.    [provider]  ?olmesartan (BENICAR) 20 MG tablet TAKE 1 TABLET(20 MG) BY MOUTH DAILY 07/25/21   [place], Betty G, MD  ?pramipexole (MIRAPEX) 0.5 MG tablet Take one tablet three times a day 07/31/21   Tat, Octaviano Batty, DO  ?propranolol (INDERAL) 40 MG tablet Take 1  tablet (40 mg total) by mouth 2 (two) times daily. 07/25/21   [place], Betty G, MD  ?   ? ?Allergies    ?Patient has no known allergies.   ? ?Review of Systems   ?Review of Systems ? ?Physical Exam ?Updated Vital Signs ?BP 124/84 (BP Location: Left Arm)   Pulse (!) 59   Temp 98.6 ?F (37 ?C) (Oral)   Resp 16   Ht 5\' 11"  (1.803 m)   Wt 96.8 kg   SpO2 97%   BMI 29.78 kg/m?  ?Physical Exam ? ?ED Results / Procedures / Treatments   ?Labs ?(all labs ordered are listed, but only abnormal results are displayed) ?Labs Reviewed - No data to display ? ?EKG ?EKG Interpretation ? ?Date/Time:  Friday October 19 2021 09:41:08 EDT ?Ventricular Rate:  61 ?PR Interval:  161 ?QRS Duration: 86 ?QT Interval:  396 ?QTC Calculation: 399 ?R Axis:   35 ?Text Interpretation: Sinus rhythm Low voltage, precordial leads Abnormal R-wave progression, early transition Confirmed by [id number] (Marianna Fuss) on 10/19/2021 9:50:23 AM ? ?Radiology ?No results found. ? ?Procedures ?Procedures  ? ? ?Medications Ordered in ED ?Medications - No data to display ? ?ED Course/ Medical Decision Making/ A&P ?  ?                        ?Medical Decision Making ? ?66 year old male presenting to  ER due to concern for getting too much of his home medication.  Specifically the facility was concerned that patient had received an extra dose of his oral propanolol.  While patient was in the emergency room he consistently denied any acute complaint.  His blood pressure was stable throughout visit.  Reviewed cardiac monitor, remained in sinus rhythm, heart rate remained in the 60s to high 50s.  His EKG demonstrates sinus rhythm with a rate of 61.  Patient was observed in the ER for a few hours and he had no episodes of clinically significant bradycardia or hypotension.  Feel he can be discharged back to his facility at this time. ? ?Review chart, reviewed last PCP note.  PCP documented hx of HLD,HTN, parkinson disease,and insomnia here today with her daughter , who is  concerned about declining and more frequent episodes of forgetfulness. ? ? ? ? ? ? ? ?Final Clinical Impression(s) / ED Diagnoses ?Final diagnoses:  ?Medication administered in error, accidental or unintentional, initial encounter  ? ? ?Rx / DC Orders ?ED Discharge Orders   ? ? None  ? ?  ? ? ?  ?Milagros Loll, MD ?10/19/21 1257 ? ?

## 2021-10-19 NOTE — ED Triage Notes (Signed)
Arrived via ems; from [facility] facility. Reported given AM meds by staff at 0700; then again at around [place] by family. Concern for one of the meds being propanolol; maybe total of 80 mg; patient denies any issues when asked, per EMS, VSS.  ?

## 2021-10-19 NOTE — ED Notes (Signed)
[facility] called for transport.  

## 2021-10-19 NOTE — ED Notes (Signed)
[facility] present to transport patient  ?

## 2021-10-19 NOTE — ED Notes (Signed)
Attempt to call report to [facility] assisted living.  No answer from nurse ?

## 2021-10-19 NOTE — ED Notes (Signed)
Patient attempted to get out of bed couple of times.  Has some confusion.  Easily redirected.  Given warm blanket and TV on for distraction.  Will continued to monitor.  Door left open for direct viewing   ?

## 2021-10-19 NOTE — Telephone Encounter (Signed)
OV note faxed over.  

## 2021-10-19 NOTE — Discharge Instructions (Signed)
Please follow-up with your primary care doctor.  I would advise having his blood pressure and heart rate checked every few hours for the rest of today and then perform routine vital checks as normal thereafter. ? ?Come back to ER if he develops any episodes of lightheadedness, passing out, dizzy spells or other new concerning symptom. ?

## 2021-10-26 ENCOUNTER — Encounter (HOSPITAL_BASED_OUTPATIENT_CLINIC_OR_DEPARTMENT_OTHER): Payer: Self-pay | Admitting: Emergency Medicine

## 2021-10-26 ENCOUNTER — Other Ambulatory Visit: Payer: Self-pay

## 2021-10-26 ENCOUNTER — Emergency Department (HOSPITAL_BASED_OUTPATIENT_CLINIC_OR_DEPARTMENT_OTHER): Payer: Medicare Other

## 2021-10-26 ENCOUNTER — Emergency Department (HOSPITAL_BASED_OUTPATIENT_CLINIC_OR_DEPARTMENT_OTHER)
Admission: EM | Admit: 2021-10-26 | Discharge: 2021-10-26 | Disposition: A | Discharge status: Skilled Nursing Facility | Payer: Medicare Other | Attending: Emergency Medicine | Admitting: Emergency Medicine

## 2021-10-26 DIAGNOSIS — S0990XA Unspecified injury of head, initial encounter: Secondary | ICD-10-CM | POA: Diagnosis present

## 2021-10-26 DIAGNOSIS — Y92129 Unspecified place in nursing home as the place of occurrence of the external cause: Secondary | ICD-10-CM | POA: Insufficient documentation

## 2021-10-26 DIAGNOSIS — G20 Parkinson's disease: Secondary | ICD-10-CM | POA: Diagnosis not present

## 2021-10-26 DIAGNOSIS — W19XXXA Unspecified fall, initial encounter: Secondary | ICD-10-CM | POA: Insufficient documentation

## 2021-10-26 MED ORDER — CARBIDOPA-LEVODOPA 25-100 MG PO TABS
2.0000 | ORAL_TABLET | Freq: Once | ORAL | Status: AC
  Administered 2021-10-26: 2 via ORAL
  Filled 2021-10-26 (×3): qty 2

## 2021-10-26 MED ORDER — PRAMIPEXOLE DIHYDROCHLORIDE 0.25 MG PO TABS
0.5000 mg | ORAL_TABLET | Freq: Once | ORAL | Status: AC
  Administered 2021-10-26: 0.5 mg via ORAL
  Filled 2021-10-26 (×3): qty 2

## 2021-10-26 NOTE — Discharge Instructions (Signed)
CT scan of the head normal.  Continue fall precautions. ?

## 2021-10-26 NOTE — ED Triage Notes (Signed)
Pt found on floor by nursing home staff.  Pt denies pain or known injury.    Pt able to ambulate at scene.  Pt has not yet taken morning medications. ? ?Vitals en route with EMS ?162 palp ?Hr 62, resp 16 ?98% RA ?CBG 134 ?

## 2021-10-26 NOTE — ED Notes (Addendum)
Handoff report given to K. Devon at [facility].  Patient awaiting [facility] transport   ?

## 2021-10-26 NOTE — ED Provider Notes (Signed)
?[facility] EM[facility] DEPT ?Provider Note ? ? ?CSN: [id number] ?Arrival date & time: 10/26/21  1009 ? ?  ? ?History ? ?Chief Complaint  ?Patient presents with  ? Fall  ? ? ?Kenneth Solomon is a 66 y.o. male. ? ?Patient with history of Parkinson's disease, former alcohol abuse who presents to the ED from skilled nursing facility after unwitnessed fall.  He denies any active pain.  He was ambulatory with EMS.  Denies any extremity pain, hip pain.  He had not taken his morning medications yet for his Parkinson's including his Sinemet.  He denies any nausea or vomiting, chest pain, shortness of breath.  Denies any neck pain.  Able to move his neck per EMS without any issues. ? ? ? ?  ? ?Home Medications ?Prior to Admission medications   ?Medication Sig Start Date End Date Taking? Authorizing Provider  ?atorvastatin (LIPITOR) 40 MG tablet Take 1 tablet (40 mg total) by mouth daily. 07/29/21   [place], Betty G, MD  ?carbidopa-levodopa (SINEMET IR) 25-100 MG tablet TAKE 2 TABLETS BY MOUTH AT 7:00 AM, 2 TABLETS AT 11:00 AM, AND 1 TABLET AT 4:00 PM 07/31/21   Tat, Octaviano Batty, DO  ?cholecalciferol (VITAMIN D3) 25 MCG (1000 UNIT) tablet Take 1,000 Units by mouth daily.    [provider]  ?escitalopram (LEXAPRO) 10 MG tablet TAKE 1 TABLET(10 MG) BY MOUTH DAILY 07/25/21   [place], Betty G, MD  ?Multiple Vitamin (MULTIVITAMIN) tablet Take 1 tablet by mouth daily.    [provider]  ?olmesartan (BENICAR) 20 MG tablet TAKE 1 TABLET(20 MG) BY MOUTH DAILY 07/25/21   [place], Betty G, MD  ?pramipexole (MIRAPEX) 0.5 MG tablet Take one tablet three times a day 07/31/21   Tat, Octaviano Batty, DO  ?propranolol (INDERAL) 40 MG tablet Take 1 tablet (40 mg total) by mouth 2 (two) times daily. 07/25/21   [place], Betty G, MD  ?   ? ?Allergies    ?Patient has no known allergies.   ? ?Review of Systems   ?Review of Systems ? ?Physical Exam ?Updated Vital Signs ?BP 127/80 (BP Location: Left Arm)   Pulse 62   Temp 98.4 ?F (36.9  ?C) (Oral)   Resp 16   SpO2 98%  ?Physical Exam ?Vitals and nursing note reviewed.  ?Constitutional:   ?   General: He is not in acute distress. ?   Appearance: He is well-developed. He is not ill-appearing.  ?HENT:  ?   Head: Normocephalic and atraumatic.  ?   Mouth/Throat:  ?   Mouth: Mucous membranes are moist.  ?Eyes:  ?   Extraocular Movements: Extraocular movements intact.  ?   Conjunctiva/sclera: Conjunctivae normal.  ?   Pupils: Pupils are equal, round, and reactive to light.  ?Cardiovascular:  ?   Rate and Rhythm: Normal rate and regular rhythm.  ?   Pulses: Normal pulses.  ?   Heart sounds: Normal heart sounds. No murmur heard. ?Pulmonary:  ?   Effort: Pulmonary effort is normal. No respiratory distress.  ?   Breath sounds: Normal breath sounds.  ?Abdominal:  ?   General: Abdomen is flat.  ?   Palpations: Abdomen is soft.  ?   Tenderness: There is no abdominal tenderness.  ?Musculoskeletal:     ?   General: No swelling or tenderness. Normal range of motion.  ?   Cervical back: Normal range of motion and neck supple. No tenderness.  ?   Comments: No midline spinal tenderness  ?Skin: ?  General: Skin is warm and dry.  ?   Capillary Refill: Capillary refill takes less than 2 seconds.  ?Neurological:  ?   General: No focal deficit present.  ?   Mental Status: He is alert and oriented to person, place, and time.  ?   Comments: Resting tremor on exam but appears to have normal strength and sensation throughout, normal speech  ?Psychiatric:     ?   Mood and Affect: Mood normal.  ? ? ?ED Results / Procedures / Treatments   ?Labs ?(all labs ordered are listed, but only abnormal results are displayed) ?Labs Reviewed - No data to display ? ?EKG ?None ? ?Radiology ?CT Head Wo Contrast ? ?Result Date: 10/26/2021 ?CLINICAL DATA:  Unwitnessed fall at nursing home. EXAM: CT HEAD WITHOUT CONTRAST TECHNIQUE: Contiguous axial images were obtained from the base of the skull through the vertex without intravenous contrast.  RADIATION DOSE REDUCTION: This exam was performed according to the departmental dose-optimization program which includes automated exposure control, adjustment of the mA and/or kV according to patient size and/or use of iterative reconstruction technique. COMPARISON:  None. FINDINGS: Brain: No evidence of acute infarction, hemorrhage, hydrocephalus, extra-axial collection or mass lesion/mass effect. Vascular: No hyperdense vessel or unexpected calcification. Skull: Normal. Negative for fracture or focal lesion. Sinuses/Orbits: Possible right frontal sinusitis. Other: None. IMPRESSION: No acute intracranial abnormality seen. Possible right frontal sinusitis. Electronically Signed   By: Lupita Raider M.D.   On: 10/26/2021 11:12   ? ?Procedures ?Procedures  ? ? ?Medications Ordered in ED ?Medications  ?carbidopa-levodopa (SINEMET IR) 25-100 MG per tablet immediate release 2 tablet (has no administration in time range)  ?pramipexole (MIRAPEX) tablet 0.5 mg (has no administration in time range)  ? ? ?ED Course/ Medical Decision Making/ A&P ?  ?                        ?Medical Decision Making ?Amount and/or Complexity of Data Reviewed ?Radiology: ordered. ? ?Risk ?Prescription drug management. ? ? ?Kenneth Solomon is here after fall at nursing home.  This was unwitnessed.  Normal vitals.  No fever.  Ambulatory at the scene.  Had not taken his morning medications yet for his Parkinson's.  History of alcohol abuse.  History of high cholesterol.  Overall he appears neurologically intact.  Per facility he is at his baseline.  Patient denies any pain.  No neck pain, normal range of motion of his neck.  Nexus criteria negative no need for neck CT.  Do not see any evidence of head trauma but given unwitnessed fall we will get a head CT.  He has no extremity tenderness on exam.  No midline tenderness on exam.  We will give him his dose of Sinemet.  I have updated family. ? ?CT scan of head per my review and interpretation shows  no acute findings.  Patient discharged back to facility. ? ?This chart was dictated using voice recognition software.  Despite best efforts to proofread,  errors can occur which can change the documentation meaning.  ? ? ? ? ? ? ? ?Final Clinical Impression(s) / ED Diagnoses ?Final diagnoses:  ?Fall, initial encounter  ? ? ?Rx / DC Orders ?ED Discharge Orders   ? ? None  ? ?  ? ? ?  ?Virgina Norfolk, DO ?10/26/21 1121 ? ?

## 2021-11-01 ENCOUNTER — Telehealth: Payer: Self-pay | Admitting: Family Medicine

## 2021-11-01 NOTE — Telephone Encounter (Signed)
Patient's daughter stopped by because she has paperwork that needs to be filled out but she is worried about if Dr.Jordan is able to fill it out. Daughter states that in the month that has passed since patient was in, his dementia has gotten tremendously worse and he was referred for hospice. Patient is now in the secured  memory part of the facility and was hospitalized as well. Daughter asked if she should have provider at patients facility fill out the paperwork and I let her know that she could ask him, per another cma, but a visit would be required to update Dr.Jordan on patient's condition since he has declined so much since he last seen Dr.Jordan a month ago. Daughter states that patient will try to exit moving vehicle if she tries to take him places so would prefer a virtual but wants to know what Dr.Jordan says ? ? ?Please advise  ?

## 2021-11-02 NOTE — Telephone Encounter (Signed)
I left Kenneth Solomon a message to return my call. ?

## 2021-11-02 NOTE — Telephone Encounter (Signed)
I saw him is 09/2021. If his daughter let us know what type of paperwork needs to be completed, he may not need to be seen again. He also needs to have an appt with his neurologist. ?Thanks, ?BJ ?

## 2021-11-05 NOTE — Telephone Encounter (Signed)
I left Kenneth Solomon another voicemail to return my call.  ?

## 2021-11-06 NOTE — Telephone Encounter (Signed)
Daughter called and is taking paperwork to her father's physician at the nursing home where he resides. No further action needed on our part ?

## 2021-11-13 ENCOUNTER — Other Ambulatory Visit: Payer: Self-pay

## 2021-11-13 ENCOUNTER — Emergency Department (HOSPITAL_COMMUNITY)
Admission: EM | Admit: 2021-11-13 | Discharge: 2021-11-14 | Disposition: A | Discharge status: Skilled Nursing Facility | Payer: Medicare Other | Attending: Emergency Medicine | Admitting: Emergency Medicine

## 2021-11-13 ENCOUNTER — Emergency Department (HOSPITAL_COMMUNITY): Payer: Medicare Other

## 2021-11-13 ENCOUNTER — Non-Acute Institutional Stay: Payer: Medicare Other | Admitting: Family Medicine

## 2021-11-13 ENCOUNTER — Encounter (HOSPITAL_COMMUNITY): Payer: Self-pay

## 2021-11-13 VITALS — BP 130/78 | HR 69 | Resp 18 | Wt 213.5 lb

## 2021-11-13 DIAGNOSIS — F1021 Alcohol dependence, in remission: Secondary | ICD-10-CM

## 2021-11-13 DIAGNOSIS — I10 Essential (primary) hypertension: Secondary | ICD-10-CM | POA: Diagnosis not present

## 2021-11-13 DIAGNOSIS — Z79899 Other long term (current) drug therapy: Secondary | ICD-10-CM | POA: Diagnosis not present

## 2021-11-13 DIAGNOSIS — Z20822 Contact with and (suspected) exposure to covid-19: Secondary | ICD-10-CM | POA: Insufficient documentation

## 2021-11-13 DIAGNOSIS — G20 Parkinson's disease: Secondary | ICD-10-CM | POA: Diagnosis not present

## 2021-11-13 DIAGNOSIS — F0280 Dementia in other diseases classified elsewhere without behavioral disturbance: Secondary | ICD-10-CM | POA: Diagnosis not present

## 2021-11-13 DIAGNOSIS — R4689 Other symptoms and signs involving appearance and behavior: Secondary | ICD-10-CM | POA: Insufficient documentation

## 2021-11-13 DIAGNOSIS — R4189 Other symptoms and signs involving cognitive functions and awareness: Secondary | ICD-10-CM

## 2021-11-13 LAB — URINALYSIS, ROUTINE W REFLEX MICROSCOPIC
Bacteria, UA: NONE SEEN
Bilirubin Urine: NEGATIVE
Glucose, UA: NEGATIVE mg/dL
Hgb urine dipstick: NEGATIVE
Ketones, ur: 20 mg/dL — AB
Leukocytes,Ua: NEGATIVE
Nitrite: NEGATIVE
Protein, ur: 30 mg/dL — AB
Specific Gravity, Urine: 1.031 — ABNORMAL HIGH (ref 1.005–1.030)
pH: 5 (ref 5.0–8.0)

## 2021-11-13 LAB — COMPREHENSIVE METABOLIC PANEL
ALT: 7 U/L (ref 0–44)
AST: 19 U/L (ref 15–41)
Albumin: 4 g/dL (ref 3.5–5.0)
Alkaline Phosphatase: 57 U/L (ref 38–126)
Anion gap: 6 (ref 5–15)
BUN: 22 mg/dL (ref 8–23)
CO2: 27 mmol/L (ref 22–32)
Calcium: 9.3 mg/dL (ref 8.9–10.3)
Chloride: 107 mmol/L (ref 98–111)
Creatinine, Ser: 1.3 mg/dL — ABNORMAL HIGH (ref 0.61–1.24)
GFR, Estimated: >60 mL/min (ref >60)
Glucose, Bld: 108 mg/dL — ABNORMAL HIGH (ref 70–99)
Potassium: 3.6 mmol/L (ref 3.5–5.1)
Sodium: 140 mmol/L (ref 135–145)
Total Bilirubin: 1.6 mg/dL — ABNORMAL HIGH (ref 0.3–1.2)
Total Protein: 7.1 g/dL (ref 6.5–8.1)

## 2021-11-13 LAB — CBC
HCT: 42.4 % (ref 39.0–52.0)
Hemoglobin: 14.3 g/dL (ref 13.0–17.0)
MCH: 31.4 pg (ref 26.0–34.0)
MCHC: 33.7 g/dL (ref 30.0–36.0)
MCV: 93.2 fL (ref 80.0–100.0)
Platelets: 248 10*3/uL (ref 150–400)
RBC: 4.55 MIL/uL (ref 4.22–5.81)
RDW: 12.6 % (ref 11.5–15.5)
WBC: 9 10*3/uL (ref 4.0–10.5)
nRBC: 0 % (ref 0.0–0.2)

## 2021-11-13 LAB — ACETAMINOPHEN LEVEL: Acetaminophen (Tylenol), Serum: <10 ug/mL — ABNORMAL LOW (ref 10–30)

## 2021-11-13 LAB — RAPID URINE DRUG SCREEN, HOSP PERFORMED
Amphetamines: NOT DETECTED
Barbiturates: NOT DETECTED
Benzodiazepines: NOT DETECTED
Cocaine: NOT DETECTED
Opiates: NOT DETECTED
Tetrahydrocannabinol: NOT DETECTED

## 2021-11-13 LAB — RESP PANEL BY RT-PCR (FLU A&B, COVID) ARPGX2
Influenza A by PCR: NEGATIVE
Influenza B by PCR: NEGATIVE
SARS Coronavirus 2 by RT PCR: NEGATIVE

## 2021-11-13 LAB — TROPONIN I (HIGH SENSITIVITY): Troponin I (High Sensitivity): 2 ng/L (ref <18)

## 2021-11-13 LAB — LACTIC ACID, PLASMA: Lactic Acid, Venous: 0.9 mmol/L (ref 0.5–1.9)

## 2021-11-13 LAB — SALICYLATE LEVEL: Salicylate Lvl: <7 mg/dL — ABNORMAL LOW (ref 7.0–30.0)

## 2021-11-13 LAB — AMMONIA: Ammonia: 21 umol/L (ref 9–35)

## 2021-11-13 LAB — ETHANOL: Alcohol, Ethyl (B): <10 mg/dL (ref <10)

## 2021-11-13 MED ORDER — AMOXICILLIN-POT CLAVULANATE 875-125 MG PO TABS: 1.0000 | ORAL_TABLET | Freq: Two times a day (BID) | ORAL | 0 refills | Status: AC

## 2021-11-13 NOTE — ED Notes (Signed)
Updated patients daughter, [name] [phone] on her fathers status. She would like to be called. She is stating this is the 3rd time this has happened and does not understand why the nursing home keeps sending him out.  ?

## 2021-11-13 NOTE — ED Notes (Signed)
Bed alarm on, door opened, nonskid socks on patient.  ?

## 2021-11-13 NOTE — Progress Notes (Signed)
[facility] Consult Note Telephone: [phone]  Fax: [phone]   Date of encounter: 11/13/21 09:25 am  PATIENT NAME: Kenneth Solomon [address] Kenneth Solomon [address]   [phone] (home)  DOB: [birth year] MRN: [id number] PRIMARY CARE PROVIDER:    [place], Betty G, MD,  [place] [address] [phone]  REFERRING PROVIDER:   [place], Betty Solomon, [place],  [place] [address] [phone]  RESPONSIBLE PARTY:    Contact Information     Name Relation Home Work Mobile   Kenneth Solomon [phone]  [phone]   Kenneth Solomon [phone]  [phone]        I met face to face with patient in his assisted living, memory care facility. Spoke with his Solomon Kenneth Solomon by phone the night prior. Palliative Care was asked to follow this patient by consultation request of  [place], Malka So, MD to address advance care planning and complex medical decision making. This is the initial visit.          ASSESSMENT, SYMPTOM MANAGEMENT AND PLAN / RECOMMENDATIONS:  Cognitive impairment Hx of Parkinson's disease with no CT documentation of atrophy. Recommend obtaining Vitamin B12, UA and RPR   2.   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 No reported evidence of Wernicke's Encephalopathy on CT 10/26/21.  Follow up Palliative Care Visit: Palliative care will continue to follow for complex medical decision making, advance care planning, and clarification of goals. Return 2 weeks or prn.    This visit was coded based on medical decision making (MDM).  PPS: 50%  HOSPICE ELIGIBILITY/DIAGNOSIS: TBD  Chief Complaint: [facility] received a referral to follow up with patient for chronic disease management of patient with Parkinson's disease with advance directive and defining/refining goals of care.  HISTORY OF PRESENT  ILLNESS:  Kenneth Solomon is a 66 y.o. year old male with Parkinson's disease, history of nephrolithiasis, anxiety and depression, hyperlipidemia, insomnia, alcohol dependence in remission x4 months and hypertension.  There is noted family history of heart disease but it is unclear if there is premature heart disease.  Can find no evidence of prior echo. Pt is unable to meaningfully provide any history today. He has had a recent unwitnessed fall, was sent to the ED and had head CT with no acute findings other than possible frontal sinusitis.  Per the nursing facility staff pt has been stripping off his clothes and wandering in his room naked frequently.  There is no formal diagnosis in the records of dementia but he does have hx of alcohol abuse.  He was being seen and worked up by Neurology for memory impairment.  At the time of that assessment the Neurologist, Dr Tat indicated that the patient was alert and oriented x 3. Today on approach pt is seen sitting naked on the side of his bed in his room trying to put shorts on.  There is urine on the bathroom floor, his belongings are pulled out of the closet.  He is wearing one shoe on one foot and one sock on the other.  He is noted to be rambling mostly non-sensically.  There is noted smeared stool on the chair in his room.  He is diaphoretic and clammy but cannot clearly answer if he is having pain or not.  Once his shorts are on he lays back on the bed and falls asleep.  He has an appointment for scheduled  neuropsychiatric testing but this appointment is not until November. Vitamin B12 level when last checked 05/30/2014 was low normal at 366.  Has history of hemoglobin A1c 5.5% with some mild elevations of random glucose but no diagnosis of diabetes.  History obtained from review of EMR, discussion with primary team, and interview with family, facility staff/caregiver and/or Kenneth Solomon.  I reviewed available labs, medications, imaging, studies and related  documents from the EMR.  Records reviewed and summarized above.   ROS Significant cognitive impairment, unable to provide hx  Physical Exam: Current and past weights: 213 lbs 8 ounces as of 10/19/21 Constitutional: NAD General:  diaphoretic and clammy EYES: anicteric sclera, lids intact, no discharge  ENMT: intact hearing, oral mucous membranes moist, dentition intact CV: S1S2, RRR, no LE edema Pulmonary: CTAB, no increased work of breathing, no cough, room air Abdomen: normo-active BS + 4 quadrants, soft and non tender, no ascites GU: deferred MSK: no sarcopenia, moves all extremities, ambulatory Skin: diaphoretic and clammy, no rashes or wounds on visible skin Neuro:  no generalized weakness, alert but disoriented, rambling non-sensically Psych: mildly anxious affect, acutely confused Hem/lymph/immuno: no widespread bruising  CURRENT PROBLEM LIST:  Patient Active Problem List   Diagnosis Date Noted   Anxiety disorder 07/25/2021   Alcohol dependence in remission ([place]) 11/16/2018   Insomnia 11/06/2018   Nephrolithiasis 05/01/2017   Parkinson disease ([place]) 08/06/2016   Tremor 08/22/2014   Essential hypertension 01/28/2014   Hyperlipidemia 01/28/2014   PAST MEDICAL HISTORY:  Active Ambulatory Problems    Diagnosis Date Noted   Essential hypertension 01/28/2014   Hyperlipidemia 01/28/2014   Nephrolithiasis 05/01/2017   Parkinson disease ([place]) 08/06/2016   Tremor 08/22/2014   Insomnia 11/06/2018   Alcohol dependence in remission ([place]) 11/16/2018   Anxiety disorder 07/25/2021   Resolved Ambulatory Problems    Diagnosis Date Noted   No Resolved Ambulatory Problems   Past Medical History:  Diagnosis Date   Alcoholism /alcohol abuse    Anxiety    Depression    Hypertension    SOCIAL HX:  Social History   Tobacco Use   Smoking status: Never   Smokeless tobacco: Current    Types: Snuff  Substance Use Topics   Alcohol use: Not Currently    Comment: recovering  alcoholic just finish up last session of AA   FAMILY HX:  Family History  Problem Relation Age of Onset   Hyperlipidemia Mother    Hypertension Mother    Stroke Mother    Heart disease Father    Cancer Sister        Leukemia   Heart disease Paternal Aunt        Preferred Pharmacy: ALLERGIES: No Known Allergies   PERTINENT MEDICATIONS:  Outpatient Encounter Medications as of 11/13/2021  Medication Sig   atorvastatin (LIPITOR) 40 MG tablet Take 1 tablet (40 mg total) by mouth daily.   carbidopa-levodopa (SINEMET IR) 25-100 MG tablet TAKE 2 TABLETS BY MOUTH AT 7:00 AM, 2 TABLETS AT 11:00 AM, AND 1 TABLET AT 4:00 PM   cholecalciferol (VITAMIN D3) 25 MCG (1000 UNIT) tablet Take 1,000 Units by mouth daily.   escitalopram (LEXAPRO) 10 MG tablet TAKE 1 TABLET(10 MG) BY MOUTH DAILY   Multiple Vitamin (MULTIVITAMIN) tablet Take 1 tablet by mouth daily.   olmesartan (BENICAR) 20 MG tablet TAKE 1 TABLET(20 MG) BY MOUTH DAILY   pramipexole (MIRAPEX) 0.5 MG tablet Take one tablet three times a day   propranolol (INDERAL) 40 MG tablet Take  1 tablet (40 mg total) by mouth 2 (two) times daily.   No facility-administered encounter medications on file as of 11/13/2021.     Advance Care Planning/Goals of Care:  CODE STATUS: Unable to locate chart to view documentation.  Pt currently with impaired judgement.    Thank you for the opportunity to participate in the care of Kenneth Solomon.  The palliative care team will continue to follow. Please call our office at [phone] if we can be of additional assistance.   Marijo Conception, FNP-C  COVID-19 PATIENT SCREENING TOOL Asked and negative response unless otherwise noted:  Have you had symptoms of covid, tested positive or been in contact with someone with symptoms/positive test in the past 5-10 days? unknown

## 2021-11-13 NOTE — ED Notes (Signed)
[facility] called for patient 

## 2021-11-13 NOTE — ED Notes (Signed)
Rectal temp was 99.1 ?
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
## 2021-11-13 NOTE — Discharge Instructions (Signed)
Work-up today is reassuring.  Urinalysis and lab work are stable.  CT head shows some evidence of sinusitis you are given a course of antibiotics.  Follow-up with your primary doctor and neurologist.  Return to the ED with new or worsening symptoms. ?

## 2021-11-14 LAB — URINE CULTURE: Culture: NO GROWTH

## 2021-11-15 ENCOUNTER — Encounter: Payer: Self-pay | Admitting: Family Medicine

## 2021-11-15 DIAGNOSIS — F03C3 Unspecified dementia, severe, with mood disturbance: Secondary | ICD-10-CM | POA: Insufficient documentation

## 2021-11-15 DIAGNOSIS — R4189 Other symptoms and signs involving cognitive functions and awareness: Secondary | ICD-10-CM | POA: Insufficient documentation

## 2021-11-25 ENCOUNTER — Other Ambulatory Visit: Payer: Self-pay

## 2021-11-25 ENCOUNTER — Emergency Department (HOSPITAL_COMMUNITY): Payer: Medicare Other

## 2021-11-25 ENCOUNTER — Emergency Department (HOSPITAL_COMMUNITY)
Admission: EM | Admit: 2021-11-25 | Discharge: 2021-11-25 | Disposition: A | Discharge status: Skilled Nursing Facility | Payer: Medicare Other | Attending: Emergency Medicine | Admitting: Emergency Medicine

## 2021-11-25 DIAGNOSIS — F0390 Unspecified dementia without behavioral disturbance: Secondary | ICD-10-CM | POA: Diagnosis not present

## 2021-11-25 DIAGNOSIS — W01198A Fall on same level from slipping, tripping and stumbling with subsequent striking against other object, initial encounter: Secondary | ICD-10-CM | POA: Diagnosis not present

## 2021-11-25 DIAGNOSIS — S0083XA Contusion of other part of head, initial encounter: Secondary | ICD-10-CM | POA: Diagnosis not present

## 2021-11-25 DIAGNOSIS — S0990XA Unspecified injury of head, initial encounter: Secondary | ICD-10-CM | POA: Diagnosis present

## 2021-11-25 NOTE — Discharge Instructions (Signed)
CT imaging is negative. Daughter has been updated.

## 2021-11-25 NOTE — ED Triage Notes (Addendum)
Pt bib ptar from [facility] at [facility] for a mechanical fall at approx 10am this morning. Per ptar, facility states pt's mental status is at baseline. Denies thinners. No LOC. Hematoma to R eye.   BP 102 palp, HR 61, RR 14, Spo2:99% RA, CBG 139

## 2021-11-25 NOTE — ED Notes (Signed)
Updated pt's daughter on pt's condition 

## 2021-11-25 NOTE — ED Provider Notes (Signed)
[facility] EMERGENCY DEPARTMENT Provider Note   CSN: [id number] Arrival date & time: 11/25/21  1459     History  Chief Complaint  Patient presents with   Kenneth Solomon is a 66 y.o. male.  HPI  66 year old male with past medical history of dementia presents to the emergency department with facial contusion, reported from mechanical fall.  Patient is from a facility.  Reported to be in his baseline mental status.  This is confirmed with the daughter on the phone.  The fall reportedly happened this morning.  Was witnessed, described as forward falling, hit the right side of his head.  No LOC.  Patient is not on anticoagulation.  Patient answers to name but offers no complaints.  Otherwise is pleasant but disoriented, again noted to be baseline.  Home Medications Prior to Admission medications   Medication Sig Start Date End Date Taking? Authorizing Provider  amoxicillin-clavulanate (AUGMENTIN) 875-125 MG tablet Take 1 tablet by mouth every 12 (twelve) hours. 11/13/21   Rancour, Jeannett Senior, MD  atorvastatin (LIPITOR) 40 MG tablet Take 1 tablet (40 mg total) by mouth daily. 07/29/21   [place], Betty G, MD  carbidopa-levodopa (SINEMET IR) 25-100 MG tablet TAKE 2 TABLETS BY MOUTH AT 7:00 AM, 2 TABLETS AT 11:00 AM, AND 1 TABLET AT 4:00 PM 07/31/21   Tat, Octaviano Batty, DO  cholecalciferol (VITAMIN D3) 25 MCG (1000 UNIT) tablet Take 1,000 Units by mouth daily.    [provider]  escitalopram (LEXAPRO) 10 MG tablet TAKE 1 TABLET(10 MG) BY MOUTH DAILY 07/25/21   [place], Betty G, MD  Multiple Vitamin (MULTIVITAMIN) tablet Take 1 tablet by mouth daily.    [provider]  olmesartan (BENICAR) 20 MG tablet TAKE 1 TABLET(20 MG) BY MOUTH DAILY 07/25/21   [place], Betty G, MD  pramipexole (MIRAPEX) 0.5 MG tablet Take one tablet three times a day 07/31/21   Tat, Octaviano Batty, DO  propranolol (INDERAL) 40 MG tablet Take 1 tablet (40 mg total) by mouth 2 (two) times daily.  07/25/21   [place], Betty G, MD      Allergies    Patient has no known allergies.    Review of Systems   Review of Systems  Unable to perform ROS: Dementia   Physical Exam Updated Vital Signs BP 113/71   Pulse 64   Temp 98.6 F (37 C) (Oral)   Resp 13   Ht 5\' 11"  (1.803 m)   Wt 90.7 kg   SpO2 99%   BMI 27.89 kg/m  Physical Exam Vitals and nursing note reviewed.  Constitutional:      Appearance: Normal appearance.  HENT:     Head: Normocephalic.     Comments: Right sided mild periorbital edema and bruising of the upper eyebrow    Mouth/Throat:     Mouth: Mucous membranes are moist.     Comments: No findings of intraoral injury Eyes:     Extraocular Movements: Extraocular movements intact.     Conjunctiva/sclera: Conjunctivae normal.     Pupils: Pupils are equal, round, and reactive to light.  Cardiovascular:     Rate and Rhythm: Normal rate.  Pulmonary:     Effort: Pulmonary effort is normal. No respiratory distress.  Abdominal:     Palpations: Abdomen is soft.     Tenderness: There is no abdominal tenderness.  Musculoskeletal:        General: No swelling or deformity.     Cervical back: No rigidity or  tenderness.  Skin:    General: Skin is warm.  Neurological:     Mental Status: He is alert and oriented to person, place, and time. Mental status is at baseline.  Psychiatric:        Mood and Affect: Mood normal.    ED Results / Procedures / Treatments   Labs (all labs ordered are listed, but only abnormal results are displayed) Labs Reviewed - No data to display  EKG None  Radiology CT Head Wo Contrast  Result Date: 11/25/2021 CLINICAL DATA:  Trauma EXAM: CT HEAD WITHOUT CONTRAST TECHNIQUE: Contiguous axial images were obtained from the base of the skull through the vertex without intravenous contrast. RADIATION DOSE REDUCTION: This exam was performed according to the departmental dose-optimization program which includes automated exposure control,  adjustment of the mA and/or kV according to patient size and/or use of iterative reconstruction technique. COMPARISON:  11/13/2021 FINDINGS: Brain: No acute infarct or hemorrhage. Lateral ventricles and midline structures are unremarkable. No acute extra-axial fluid collections. No mass effect. Vascular: Stable dense atherosclerosis at the right ICA terminus and region of the proximal right MCA. No hyperdense vessel. Skull: Normal. Negative for fracture or focal lesion. Sinuses/Orbits: Stable opacification of the right frontal sinuses. Remaining paranasal sinuses are clear. Other: None. IMPRESSION: 1. Stable head CT, no acute intracranial process. Electronically Signed   By: Sharlet Salina M.D.   On: 11/25/2021 16:09   CT Cervical Spine Wo Contrast  Result Date: 11/25/2021 CLINICAL DATA:  Trauma EXAM: CT CERVICAL SPINE WITHOUT CONTRAST TECHNIQUE: Multidetector CT imaging of the cervical spine was performed without intravenous contrast. Multiplanar CT image reconstructions were also generated. RADIATION DOSE REDUCTION: This exam was performed according to the departmental dose-optimization program which includes automated exposure control, adjustment of the mA and/or kV according to patient size and/or use of iterative reconstruction technique. COMPARISON:  None Available. FINDINGS: Alignment: Alignment is grossly anatomic. Skull base and vertebrae: No acute fracture. No primary bone lesion or focal pathologic process. Soft tissues and spinal canal: No prevertebral fluid or swelling. No visible canal hematoma. Disc levels: Mild C4-5 and C5-6 spondylosis. No significant compressive sequela. Upper chest: Airway is patent.  Lung apices are clear. Other: Reconstructed images demonstrate no additional findings. IMPRESSION: 1. No acute cervical spine fracture. Electronically Signed   By: Sharlet Salina M.D.   On: 11/25/2021 16:06   CT Maxillofacial WO CM  Result Date: 11/25/2021 CLINICAL DATA:  Trauma EXAM: CT  MAXILLOFACIAL WITHOUT CONTRAST TECHNIQUE: Multidetector CT imaging of the maxillofacial structures was performed. Multiplanar CT image reconstructions were also generated. RADIATION DOSE REDUCTION: This exam was performed according to the departmental dose-optimization program which includes automated exposure control, adjustment of the mA and/or kV according to patient size and/or use of iterative reconstruction technique. COMPARISON:  None Available. FINDINGS: Osseous: No fracture or mandibular dislocation. No destructive process. Prominent dental caries are seen involving the bilateral upper posterior molars and right lower molars. Orbits: Negative. No traumatic or inflammatory finding. Sinuses: Stable opacification of the right frontal sinus. Minimal polypoid mucosal thickening in the left maxillary sinus. Remaining sinuses are clear. Soft tissues: Negative. Limited intracranial: No significant or unexpected finding. IMPRESSION: 1. No acute facial bone fracture. Electronically Signed   By: Sharlet Salina M.D.   On: 11/25/2021 16:13    Procedures Procedures    Medications Ordered in ED Medications - No data to display  ED Course/ Medical Decision Making/ A&P  Medical Decision Making Amount and/or Complexity of Data Reviewed Radiology: ordered.   66 year old male presents emergency department for a reported witnessed mechanical fall earlier this morning.  No LOC or syncope.  Was sent here for evaluation after developing some mild right-sided facial swelling/bruising.  Not on any anticoagulation.  Noted to be baseline mental status per the facility and the daughter.  Daughter notified by phone.  No other admitted recent illness or symptoms.  CT of the head, face and cervical spine are unremarkable.  The right eye is unremarkable, normal conjunctive a, no signs of ocular trauma.  Do not feel the patient requires emergent lab evaluation given the mechanical witnessed  nature.  Daughter has been notified of the negative CT imaging and plan to send back to the facility, she is grateful.  Patient at this time appears safe and stable for discharge and close outpatient follow up. Discharge plan and strict return to ED precautions discussed, patient verbalizes understanding and agreement.        Final Clinical Impression(s) / ED Diagnoses Final diagnoses:  Contusion of face, initial encounter    Rx / DC Orders ED Discharge Orders     None         Rozelle LoganHorton, Wakeelah Solan M, DO 11/25/21 1747

## 2021-11-28 ENCOUNTER — Non-Acute Institutional Stay: Payer: Medicare Other | Admitting: Family Medicine

## 2021-11-28 VITALS — BP 112/60 | HR 81 | Resp 18

## 2021-11-28 DIAGNOSIS — F03C3 Unspecified dementia, severe, with mood disturbance: Secondary | ICD-10-CM

## 2021-11-28 DIAGNOSIS — R296 Repeated falls: Secondary | ICD-10-CM

## 2021-11-28 DIAGNOSIS — E7220 Disorder of urea cycle metabolism, unspecified: Secondary | ICD-10-CM

## 2021-11-29 ENCOUNTER — Encounter: Payer: Self-pay | Admitting: Family Medicine

## 2021-11-29 ENCOUNTER — Telehealth: Payer: Self-pay | Admitting: Neurology

## 2021-11-29 DIAGNOSIS — R296 Repeated falls: Secondary | ICD-10-CM | POA: Insufficient documentation

## 2021-11-29 DIAGNOSIS — E7220 Disorder of urea cycle metabolism, unspecified: Secondary | ICD-10-CM | POA: Insufficient documentation

## 2021-11-29 DIAGNOSIS — G20 Parkinson's disease: Secondary | ICD-10-CM

## 2021-11-29 NOTE — Telephone Encounter (Signed)
Received call from a doctor "friend" today who wanted to speak with me.  While I can't speak with him due to HIPAA laws, I did open the chart and am very concerned about patient behavior and deterioration that he has experienced.  This may be from the pramipexole.  Please call daughter and find out where he is living (was [facility]) and have them decrease pramipexole to 0.5 mg bid for 1 week, the qday for 1 week and then STOP the pramipexole.  Make an appt for 6/12 at 2:30 to see me

## 2021-11-29 NOTE — Telephone Encounter (Signed)
Caller Dr. Roxan Hockey LM with [facility], he needs to speak with Tat about patient who has been admitted to [facility] due to acute dementia.states he is a friend of the family.

## 2021-11-29 NOTE — Progress Notes (Signed)
[facility] Palliative Care Consult Note Telephone: [phone]  Fax: [phone]    Date of encounter: 11/28/2021 5:30 PM PATIENT NAME: Kenneth Solomon [address] [place] [address]   [phone] (home)  DOB: 19-May-1956 MRN: [id number] PRIMARY CARE PROVIDER:    Martinique, Betty G, MD,  [place] [address] [phone]  REFERRING PROVIDER:   Martinique, Betty G, [place],  [place] [address] [phone]  RESPONSIBLE PARTY:    Contact Information     Name Relation Home Work Mobile   [place] Daughter [phone]  [phone]   Jermane, Brayboy [phone]  [phone]   Kirin, Pastorino   [phone]        I met face to face with patient in skilled care facility. Palliative Care was asked to follow this patient by consultation request of  Martinique, Malka So, MD to address advance care planning and complex medical decision making. This is a follow up visit.                                   ASSESSMENT, SYMPTOM MANAGEMENT AND PLAN / RECOMMENDATIONS:   Dementia with behavioral disturbance FAST 7 score 7b with apparent weight loss Disorganized with poor safety awareness No significant improvement with anti-psychotic but needs upwards titration of dose Referral has been made by facility NP to Hospice  2.  Recurrent falls Likely related to both poor safety awareness and impulsive behavior as well as progression of dementia May be complicated some by medication to reduce agitation and anxiety  3.  Hyperammonemia Resolved with recent treatment with Lactulose.  Advance Care Planning/Goals of Care: Goals include to maximize quality of life and symptom management.  Exploration of goals of care in the event of a sudden injury or illness-Daughter and HC POA, [name] has previously indicated that she wants comfort care only and does not want pt transported to the  hospital Identification of a healthcare agent -daughter [name] is [facility] POA Review and updating or creation of an advance directive document-attempting to send MOST to daughter online for completion Decision not to resuscitate or to de-escalate disease focused treatments due to poor prognosis. CODE STATUS: DNR/DNI/Comfort Care Daughter expressed previously that she did not want IV fluids, antibiotics or feeding tubes and desires pt not to be transported to the hospital     Follow up Palliative Care Visit: Palliative care will defer to Hospice for evaluation for admission to service and continue to follow if needed.   This visit was coded based on medical decision making (MDM).  PPS: 30%  HOSPICE ELIGIBILITY/DIAGNOSIS:  Dementia with protein calorie malnutrition  Chief Complaint:   HISTORY OF PRESENT ILLNESS:  Kenneth Solomon is a 66 y.o. year old male with Dementia diagnosis recently given by visiting facility psychiatric NP provider.  Vitamin B12 has been sufficient in the 600 range on admission.  RPR is non-reactive.  Pt remains ambulatory, very disorganized and difficult to redirect with poor safety awareness. Head CT from 11/13/21 demonstrating atrophy with no acute infarct or mass noted.  Pt had a fall over the weekend and was noted to have a black eye so he was transported to the ED.  CT head, face and cervical spine were all negative for acute bleed or fractures. Daughter previously expressed her wishes as [facility] POA that pt receive comfort based only care in the facility and  not be sent to the ER.  On last visit pt was noted to have mildly elevated ammonia of 71 treated with 3 days of daily Lactulose with repeat level of 21.  No other noted explanation for cognitive impairment noted.  He was started on last visit on Clonazepam 0.5 mg BID prn and Seroquel 25 mg QHS with no significant improvement in function.  Per nursing staff pt will frequently take off all his clothes and wander in the unit  even into other residents' rooms.  Today he was noted to wander into another resident's room by the staff where he proceeded to defecate all over the room and couch, smearing feces.  Staff states he will be noted to be down and crawling on the floor or at times will approach another resident and shake at their walker.  On approach he is seen sitting in the dining hall, staff indicates at times he will eat, at others he refuses intake. He is noted to be disheveled and slightly diaphoretic.  Denies pain and doesn't appear uncomfortable.  He will occasionally answer no but otherwise there is mumbling without intelligible conversation. He is tolerant of exam.  Denies pain, headache or visual change although not truly clear that he comprehends anything being asked, he was tolerant of the exam. Staff denies any observed nausea or vomiting.  Attempted later in the evening to contact pt's daughter and [facility] POA to discuss Hospice Referral and MOST.  Left message on her voice mail in response to voice mail left regarding pt being sent to the hospital.  Advised that although pt is DNR, the facility still has legal obligations for his care particularly if there is a fall with head injury.  Explained that if pt dies as a result of fall that this is a reportable event and could potentially be considered a medical examiner case. Advised daughter that referral is being made for Hospice in accordance with his presentation and her wishes for comfort only care.  Advised daughter that we could complete MOST where she can write in her wishes that he not receive any care other than comfort care or invasive testing. Left number for call back and attempted to send MOST online for her signature/completion.  History obtained from review of EMR, discussion with facility staff/caregiver and/or Mr. Orrego. Left vm for daughter I reviewed available labs, medications, imaging, studies and related documents from the EMR.  Records reviewed and  summarized above.   ROS Severe dementia, unable to obtain from pt  Physical Exam: Current and past weights: unavailable except prior weight of 200 lbs 11/25/21 Constitutional: NAD General: disheveled, slightly diaphoretic EYES: anicteric sclera, lids intact, no discharge. Right eye with purple, black and yellow discoloration of eyelid and orbital rim.  Sclerae white ENMT: intact hearing, oral mucous membranes moist, dentition intact CV: S1S2, RRR, no LE edema Pulmonary: CTAb, no increased work of breathing, no cough, room air Abdomen: hypo-active BS + 4 quadrants, soft and non tender, no ascites GU: deferred MSK: moves all extremities, ambulatory.  Noted resting tremor worse in right hand, but worsens with movement of left hand Neuro:  no generalized weakness,  disorganized and confused, aphasic (unclear if both receptive and expressive) Psych: Confused, disorganized with poor safety awareness Hem/lymph/immuno: no widespread bruising   Thank you for the opportunity to participate in the care of Kenneth Solomon.  The palliative care team will continue to follow. Please call our office at [phone] if we can be of additional assistance.  Marijo Conception, FNP-C  COVID-19 PATIENT SCREENING TOOL Asked and negative response unless otherwise noted:   Have you had symptoms of covid, tested positive or been in contact with someone with symptoms/positive test in the past 5-10 days?  Unknown

## 2021-11-29 NOTE — Telephone Encounter (Signed)
Dr. 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 He keeps saying different types of dementia. 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 I told him we would be reaching out to the patients daughter.

## 2021-11-29 NOTE — Telephone Encounter (Signed)
Called patients daughter and left a message for a call back. 

## 2021-11-30 MED ORDER — PRAMIPEXOLE DIHYDROCHLORIDE 0.5 MG PO TABS: ORAL_TABLET | ORAL | 0 refills | Status: AC

## 2021-11-30 NOTE — Telephone Encounter (Signed)
Called [facility] and spoke to [employer]. Informed Deanna Artis that Dr. Arbutus Leas would like to stop patients Pramipexole 0.5mg . Deanna Artis has instructed that we fax an order in with instructions to stop medication. Fax number provided to send orders is: [phone]

## 2021-11-30 NOTE — Telephone Encounter (Signed)
Called and spoke to patients daughter Kenneth Solomon. Informed her that Dr. Arbutus Leas is sending orders to the nursing facility to stop patients pramipexole. Also asked Kenneth Solomon if they willing to bring patient in for a visit on 6/12 or do they prefer to let facility physician manage his care? Patients daughter asked if Dr. Arbutus Leas would be able to prescribe him something to calm him down because patient is really agitated and anxious. I informed her that I am not sure what Dr. Arbutus Leas plans to do, but she will need to have a visit with him to access patient to determine what her plan of care will be.   Patients daughter wanted to know if Dr. Arbutus Leas would be willing to do a Virtual Visit? She states she thinks her dad might jump out of her car.   Patients daughter also informed me that the family friend doctor that called yesterday in regards to patient is her step-father.

## 2021-11-30 NOTE — Telephone Encounter (Signed)
Called [place] at [phone] and was informed that they do not have [name] as a resident at their facility.    Called and spoke to patients daughter and she stated that her father is at [facility] in [place]. She stated that her father has declined so much so fast. She stated that she is checking to put her dad on Hospice Care. Marchelle Folks stated that she needs her dad to be diagnosed with dementia because the facility will not give him anything to calm him down because he has not been diagnosed with Dementia. Marchelle Folks explains that her dad is wheelchair bound and has no idea who anyone is or has no idea where he is. 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   Patient is still taking all medication from Dr. Arbutus Leas as prescribed. I asked specifically if he was still taking the pramipexole and she stated yes. I informed her that sometimes this medication can cause patient to "act out" like patient has been and Marchelle Folks stated that she thinks there is much more to it.  Marchelle Folks states she is very worried about her father and doesn't think he will live too much longer and just wants him home with hospice care to keep him comfortable. I informed patients daughter I will send this message to Dr. Arbutus Leas and be in touch with her.

## 2021-11-30 NOTE — Telephone Encounter (Signed)
Orders to have Pramipexole has been faxed. Called patients daughter Marchelle Folks and left a message for a call back.

## 2021-12-04 NOTE — Telephone Encounter (Signed)
Called patients daughter Amanda and left a message for a call back.  

## 2021-12-05 ENCOUNTER — Telehealth: Payer: Self-pay | Admitting: Neurology

## 2021-12-05 NOTE — Telephone Encounter (Signed)
Called patients daughter this morning and she has returned the call with this information anything you would like me to further say to her ?

## 2021-12-05 NOTE — Telephone Encounter (Signed)
Called patients daughter left voicemail

## 2021-12-05 NOTE — Telephone Encounter (Signed)
Patients daughter called and stated Kenneth Solomon is in hospice care.  She went ahead and canceled all his upcoming appts, but stated if there are further recommendations to please let them know.

## 2022-02-28 ENCOUNTER — Ambulatory Visit: Payer: Medicare Other | Admitting: Neurology

## 2022-05-28 ENCOUNTER — Encounter: Payer: Medicare Other | Admitting: Psychology

## 2022-06-04 ENCOUNTER — Encounter: Payer: Medicare Other | Admitting: Psychology

## 2022-08-01 DEATH — deceased

## 2023-02-20 IMAGING — CT CT MAXILLOFACIAL W/O CM
3 of 6 series · 16 of 47 positions shown, 19 images · non-contrast
Comparison: None Available.

CLINICAL DATA: Trauma



[Series 3: maxilllofacial 2.0 hr40 3 · axial · 0.38mm/px · z∈[+1476,+1608]mm · 11 of 78 slices shown, 14 images]
[im 6/78  brain]
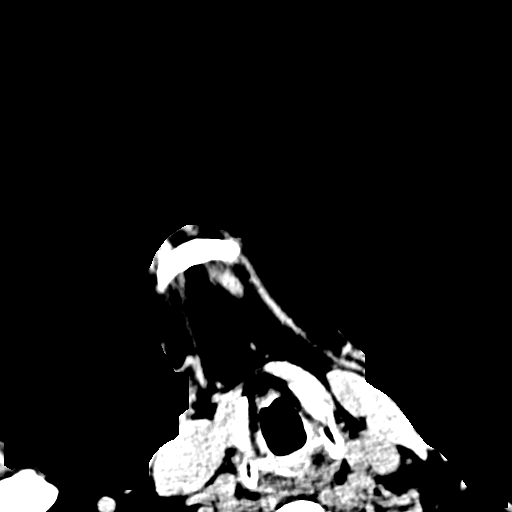
[im 6/78  bone]
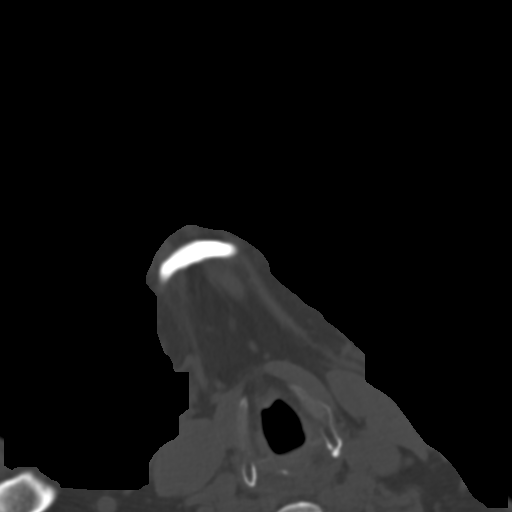
[im 12/78  bone]
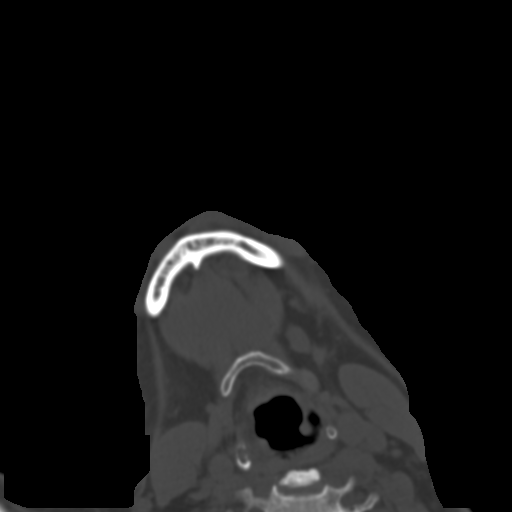
[im 17/78  bone]
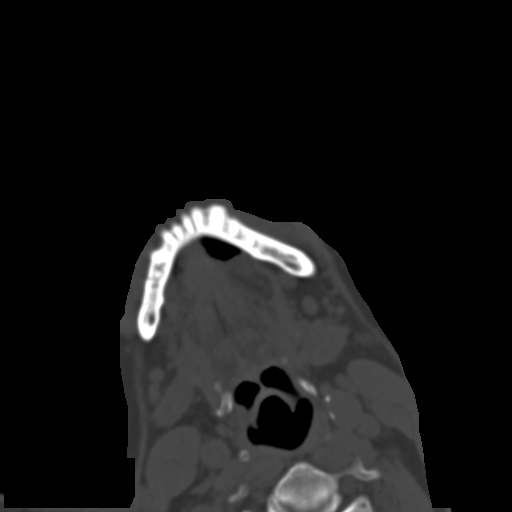
[im 28/78  bone]
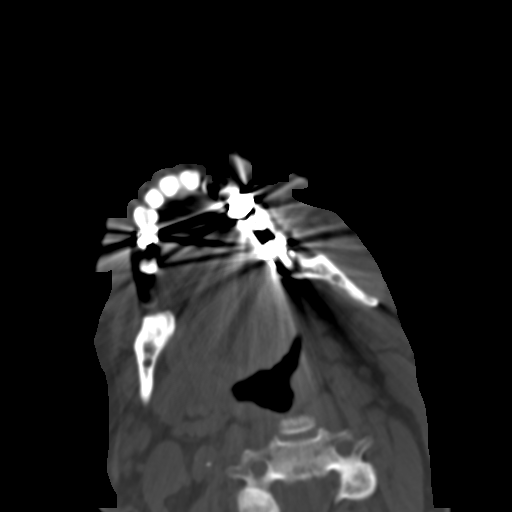
[im 34/78  brain]
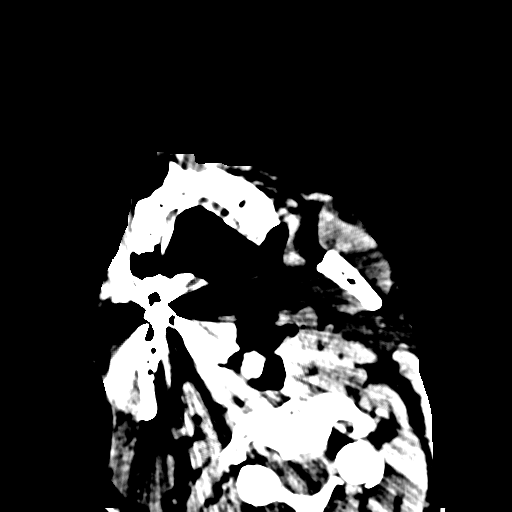
[im 34/78  bone]
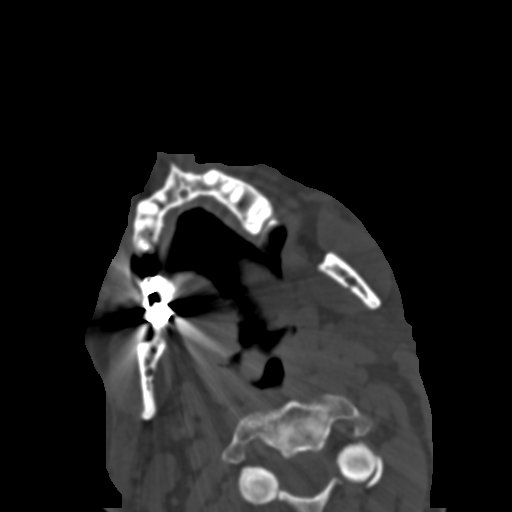
[im 39/78  bone]
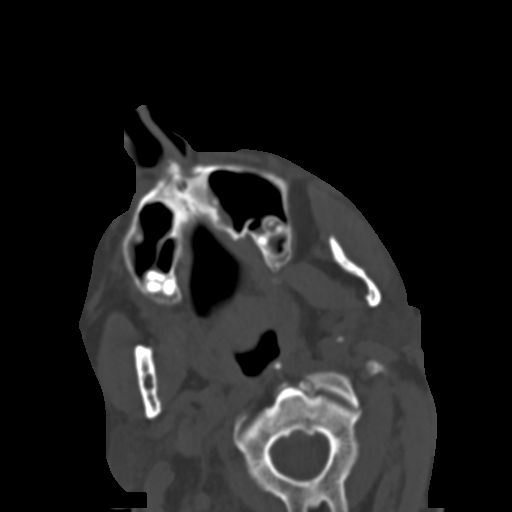
[im 45/78  bone]
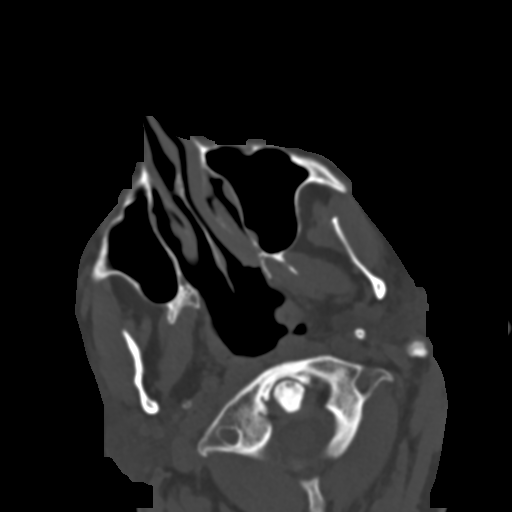
[im 50/78  bone]
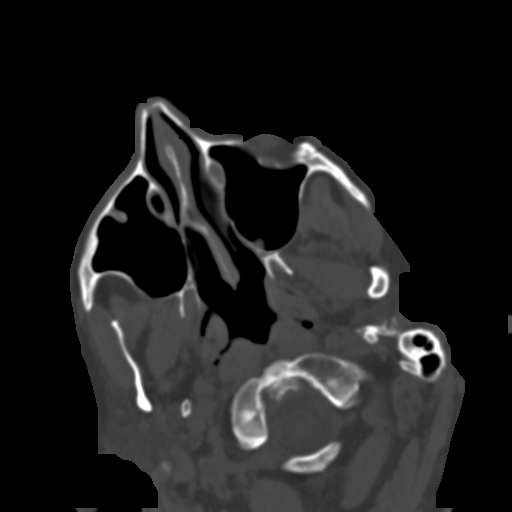
[im 61/78  brain]
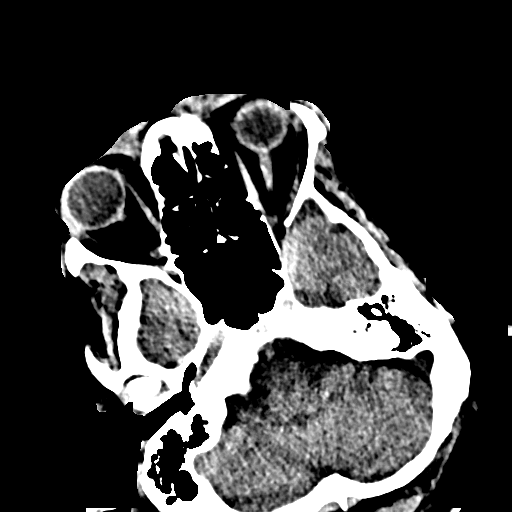
[im 61/78  bone]
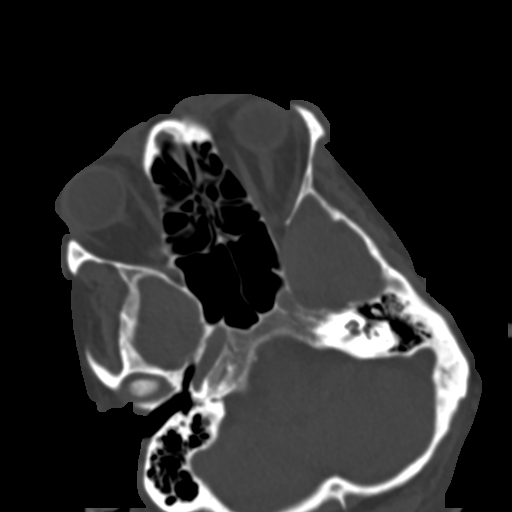
[im 67/78  bone]
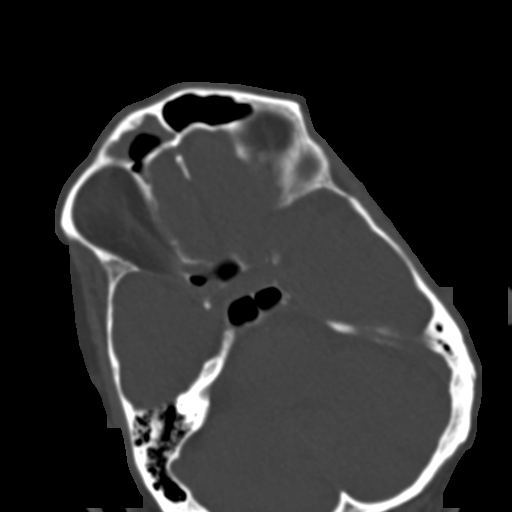
[im 72/78  bone]
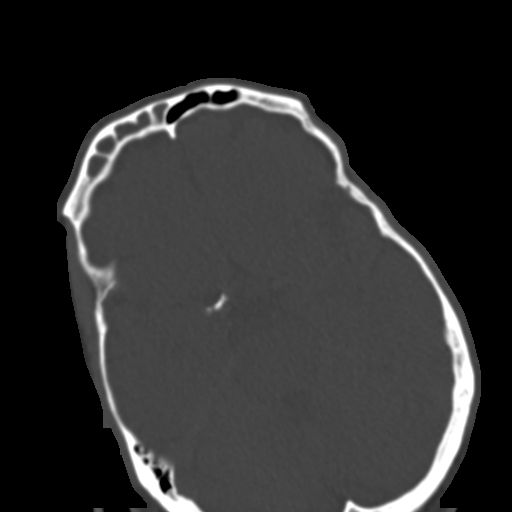

[Series 7: st cor · coronal · 0.35mm/px · 3 of 94 slices shown]
[im 24/94  bone]
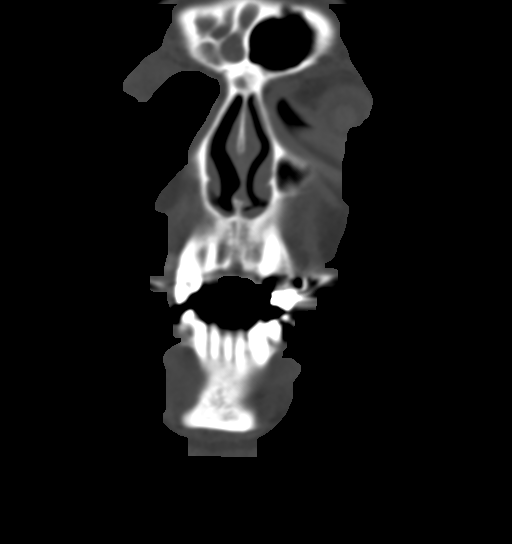
[im 47/94  bone]
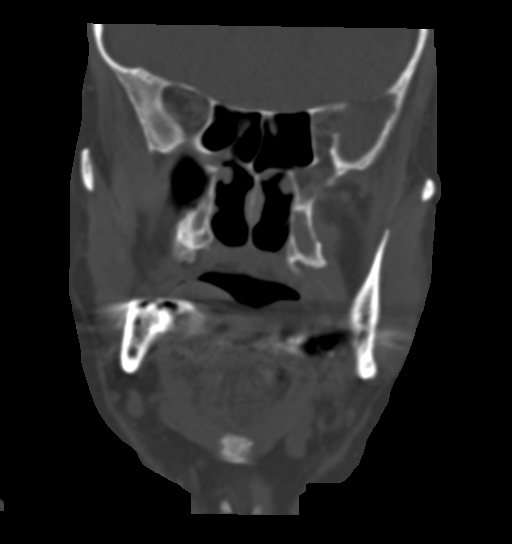
[im 70/94  bone]
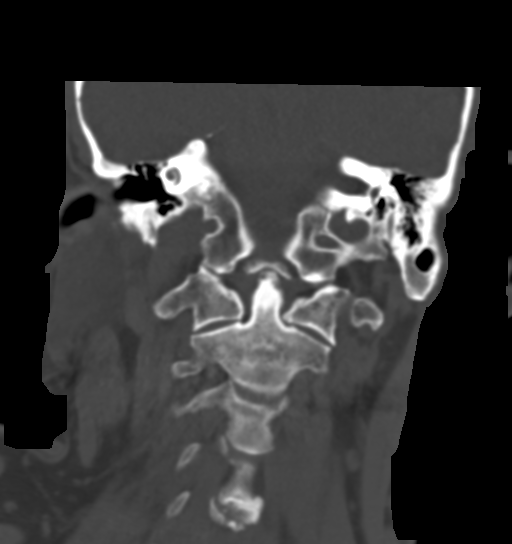

[Series 10: bone sag · sagittal · 0.37mm/px · 2 of 90 slices shown]
[im 30/90  bone]
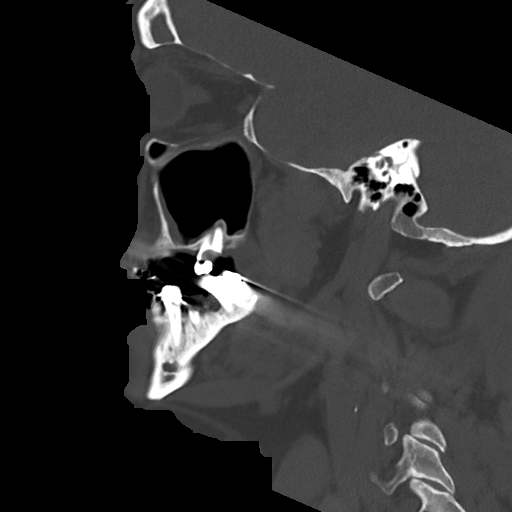
[im 60/90  bone]
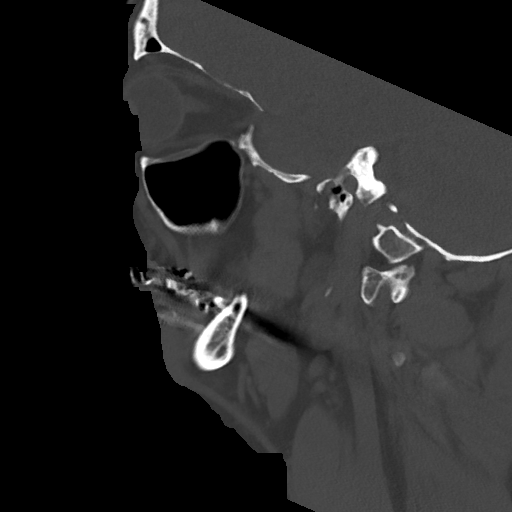

[16 of 47 positions shown; findings below may reference images not displayed]

FINDINGS: Osseous: No fracture or mandibular dislocation. No destructive
process. Prominent dental caries are seen involving the bilateral
upper posterior molars and right lower molars.

Orbits: Negative. No traumatic or inflammatory finding.

Sinuses: Stable opacification of the right frontal sinus. Minimal
polypoid mucosal thickening in the left maxillary sinus. Remaining
sinuses are clear.

Soft tissues: Negative.

Limited intracranial: No significant or unexpected finding.
IMPRESSION: 1. No acute facial bone fracture.

## 2023-02-20 IMAGING — CT CT HEAD W/O CM
4 series · 17 of 47 positions shown, 19 images · non-contrast
Comparison: 11/13/2021

CLINICAL DATA: Trauma



[Series 3: head wo · axial · 0.45mm/px · z∈[+1584,+1704]mm · 7 of 33 slices shown, 9 images]
[im 5/33  brain]
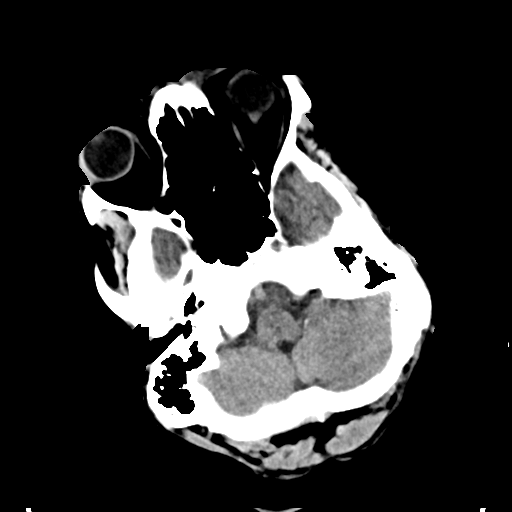
[im 5/33  bone]
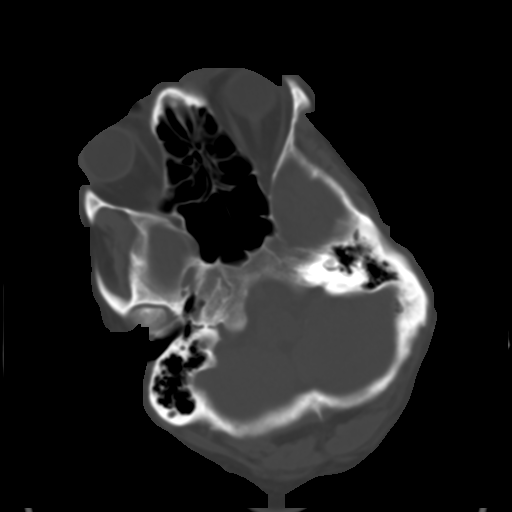
[im 9/33  brain]
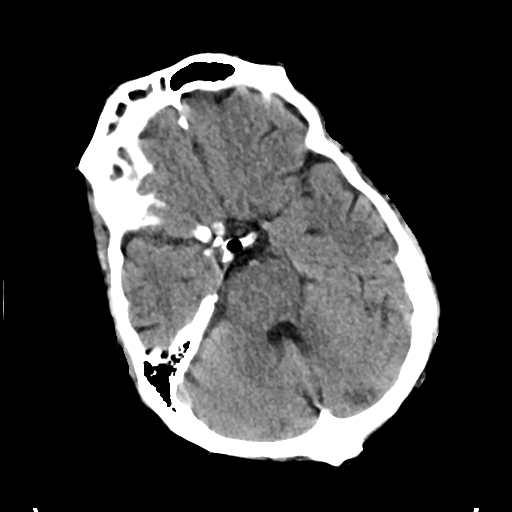
[im 13/33  brain]
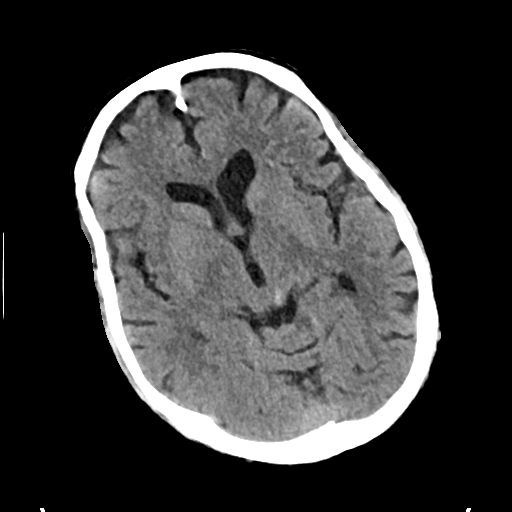
[im 17/33  brain]
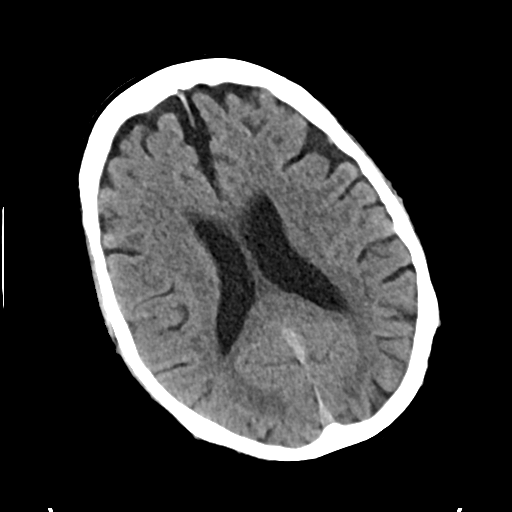
[im 21/33  brain]
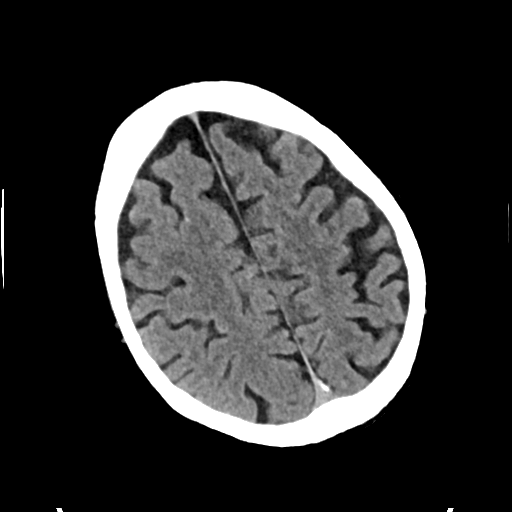
[im 21/33  bone]
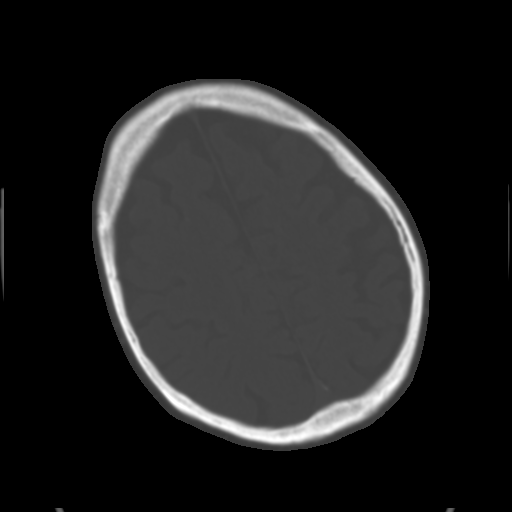
[im 25/33  brain]
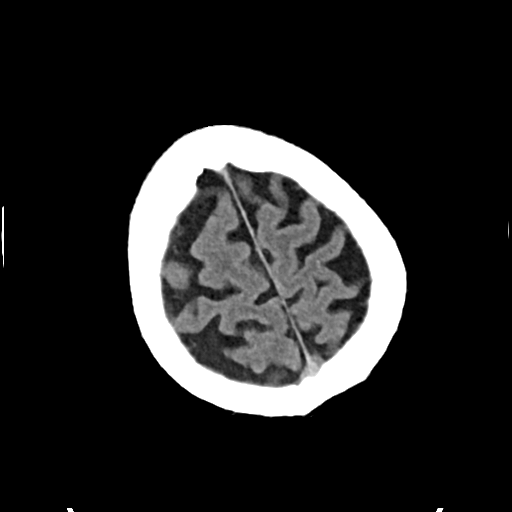
[im 29/33  brain]
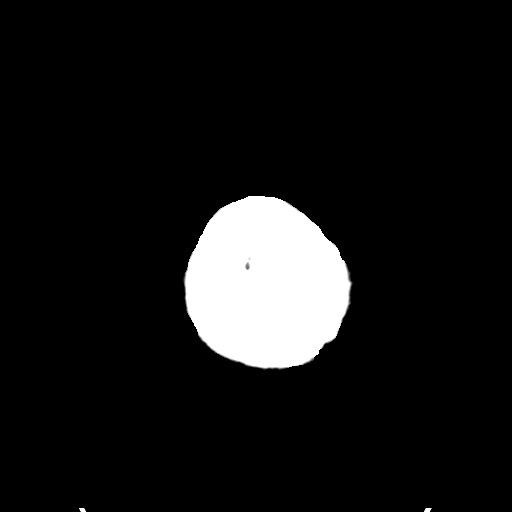

[Series 4: head bone · axial · 0.45mm/px · z∈[+1580,+1636]mm · 4 of 81 slices shown]
[im 9/81  bone]
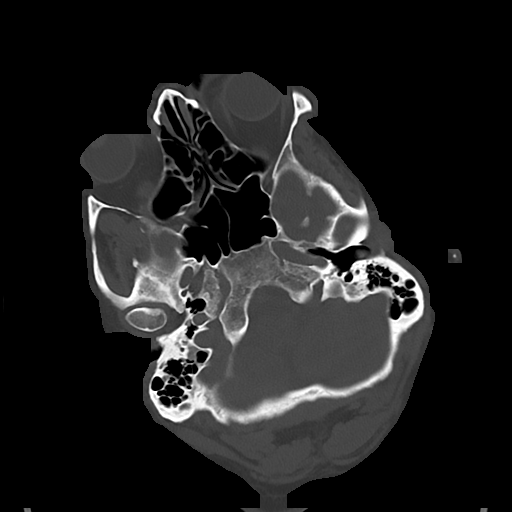
[im 17/81  bone]
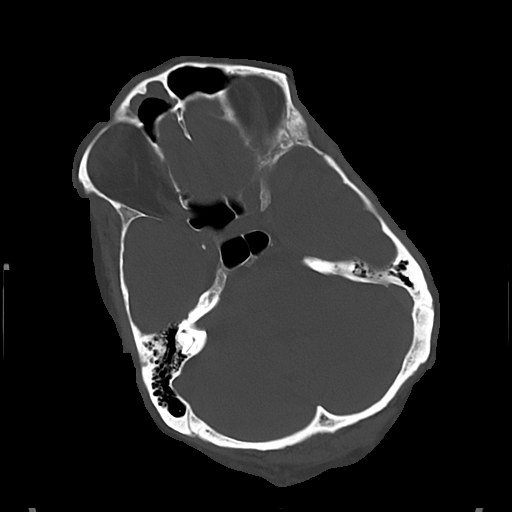
[im 25/81  bone]
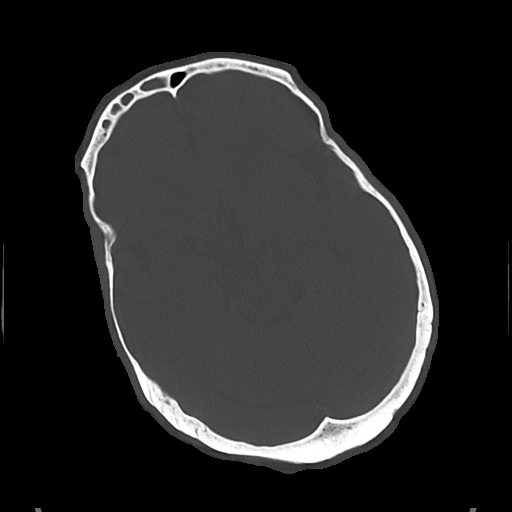
[im 37/81  bone]
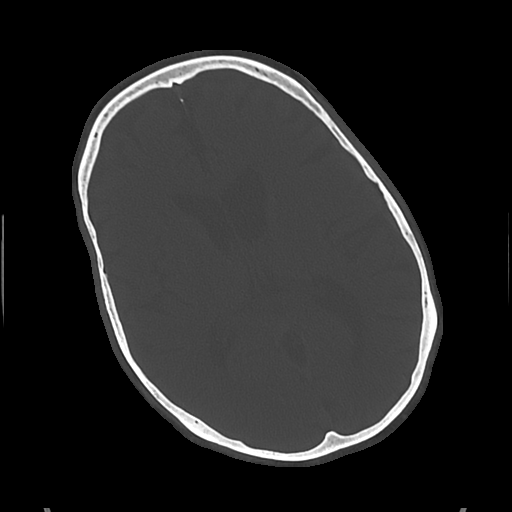

[Series 5: cor soft · coronal · 0.33mm/px · 3 of 69 slices shown]
[im 23/69  brain]
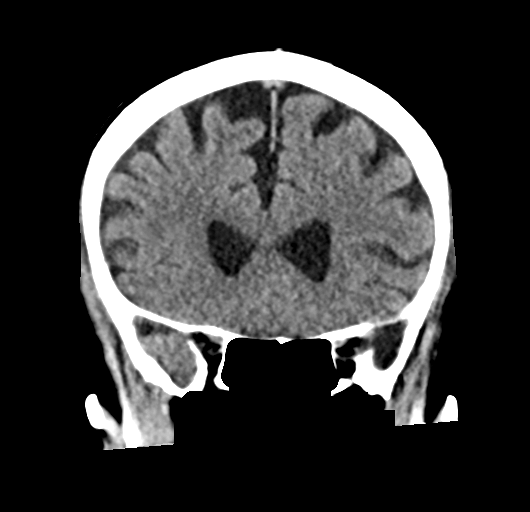
[im 31/69  brain]
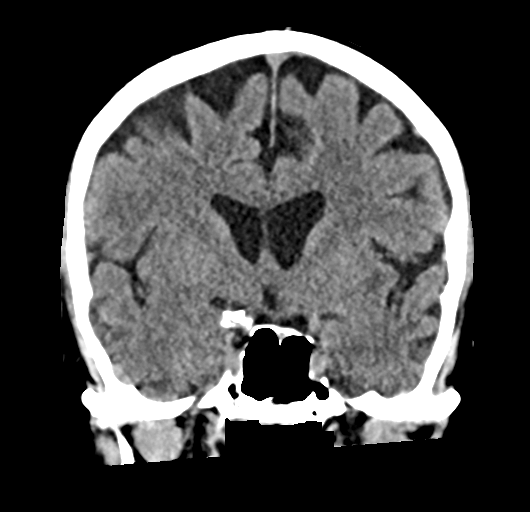
[im 38/69  brain]
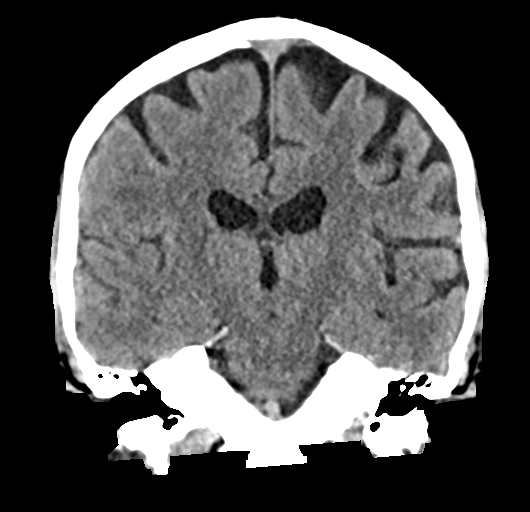

[Series 6: sag soft · sagittal · 0.33mm/px · 3 of 56 slices shown]
[im 19/56  brain]
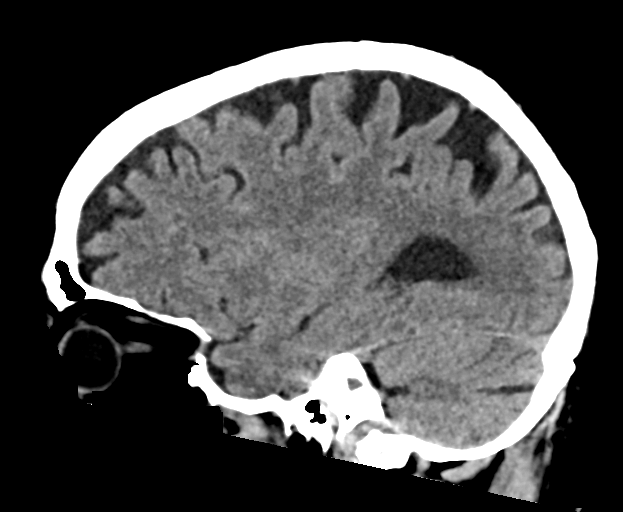
[im 28/56  brain]
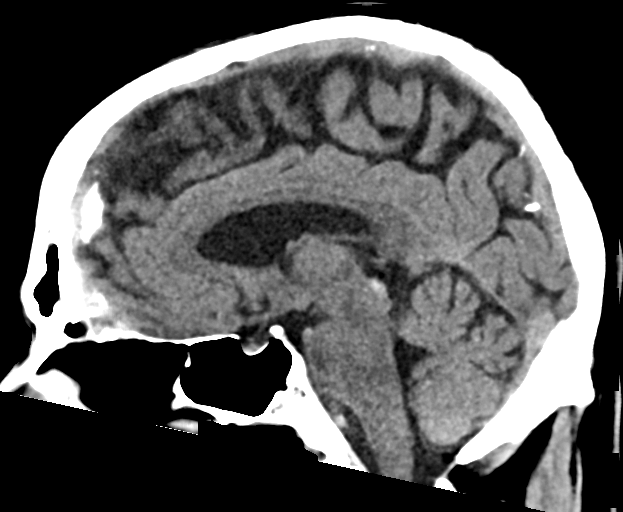
[im 37/56  brain]
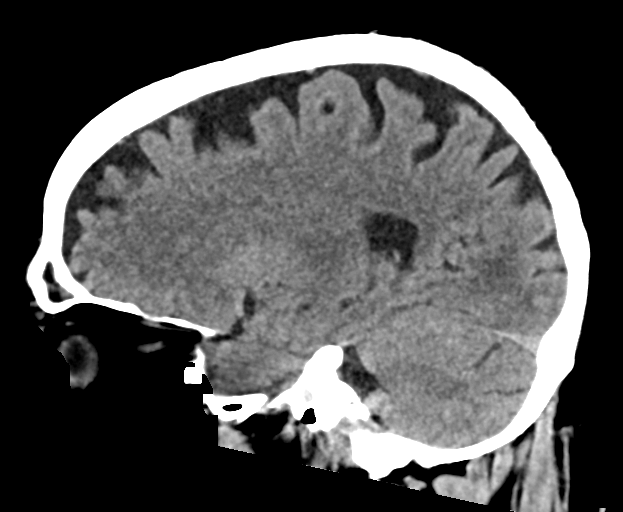

[17 of 47 positions shown; findings below may reference images not displayed]

FINDINGS: Brain: No acute infarct or hemorrhage. Lateral ventricles and
midline structures are unremarkable. No acute extra-axial fluid
collections. No mass effect.

Vascular: Stable dense atherosclerosis at the right ICA terminus and
region of the proximal right MCA. No hyperdense vessel.

Skull: Normal. Negative for fracture or focal lesion.

Sinuses/Orbits: Stable opacification of the right frontal sinuses.
Remaining paranasal sinuses are clear.

Other: None.
IMPRESSION: 1. Stable head CT, no acute intracranial process.
